# Patient Record
Sex: Female | Born: 1960 | Race: White | Marital: Married | Smoking: Never smoker
Health system: Southern US, Community
[De-identification: ages and names within clinical notes are randomized; demographics above are authoritative.]

## PROBLEM LIST (undated history)

## (undated) DIAGNOSIS — Z800 Family history of malignant neoplasm of digestive organs: Secondary | ICD-10-CM

## (undated) DIAGNOSIS — T4145XA Adverse effect of unspecified anesthetic, initial encounter: Secondary | ICD-10-CM

## (undated) DIAGNOSIS — T8859XA Other complications of anesthesia, initial encounter: Secondary | ICD-10-CM

## (undated) DIAGNOSIS — Z8042 Family history of malignant neoplasm of prostate: Secondary | ICD-10-CM

## (undated) DIAGNOSIS — T7840XA Allergy, unspecified, initial encounter: Secondary | ICD-10-CM

## (undated) DIAGNOSIS — Z803 Family history of malignant neoplasm of breast: Secondary | ICD-10-CM

## (undated) DIAGNOSIS — C50919 Malignant neoplasm of unspecified site of unspecified female breast: Secondary | ICD-10-CM

## (undated) DIAGNOSIS — T753XXA Motion sickness, initial encounter: Secondary | ICD-10-CM

## (undated) HISTORY — DX: Malignant neoplasm of unspecified site of unspecified female breast: C50.919

## (undated) HISTORY — PX: ENDOMETRIAL ABLATION: SHX621

## (undated) HISTORY — DX: Family history of malignant neoplasm of digestive organs: Z80.0

## (undated) HISTORY — PX: WRIST SURGERY: SHX841

## (undated) HISTORY — PX: WISDOM TOOTH EXTRACTION: SHX21

## (undated) HISTORY — DX: Allergy, unspecified, initial encounter: T78.40XA

## (undated) HISTORY — DX: Family history of malignant neoplasm of prostate: Z80.42

## (undated) HISTORY — DX: Family history of malignant neoplasm of breast: Z80.3

---
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
## 2017-07-18 ENCOUNTER — Ambulatory Visit
Admission: RE | Admit: 2017-07-18 | Discharge: 2017-07-18 | Disposition: A | Discharge status: Home / Self Care | Payer: BC Managed Care – PPO | Source: Ambulatory Visit | Attending: Family Medicine | Admitting: Family Medicine

## 2017-07-18 ENCOUNTER — Other Ambulatory Visit: Payer: Self-pay | Admitting: Family Medicine

## 2017-07-18 DIAGNOSIS — M2560 Stiffness of unspecified joint, not elsewhere classified: Secondary | ICD-10-CM

## 2017-07-18 DIAGNOSIS — M79642 Pain in left hand: Secondary | ICD-10-CM

## 2017-12-26 ENCOUNTER — Other Ambulatory Visit (HOSPITAL_COMMUNITY)
Admission: RE | Admit: 2017-12-26 | Discharge: 2017-12-26 | Disposition: A | Discharge status: Home / Self Care | Payer: BC Managed Care – PPO | Source: Ambulatory Visit | Attending: Family Medicine | Admitting: Family Medicine

## 2017-12-26 ENCOUNTER — Other Ambulatory Visit: Payer: Self-pay | Admitting: Family Medicine

## 2017-12-26 DIAGNOSIS — Z0000 Encounter for general adult medical examination without abnormal findings: Secondary | ICD-10-CM | POA: Diagnosis present

## 2017-12-30 LAB — CYTOLOGY - PAP
Diagnosis: NEGATIVE
HPV: NOT DETECTED

## 2018-03-03 DIAGNOSIS — M65332 Trigger finger, left middle finger: Secondary | ICD-10-CM | POA: Insufficient documentation

## 2018-03-03 DIAGNOSIS — M65312 Trigger thumb, left thumb: Secondary | ICD-10-CM | POA: Insufficient documentation

## 2018-12-22 ENCOUNTER — Encounter: Payer: Self-pay | Admitting: Internal Medicine

## 2019-01-05 ENCOUNTER — Other Ambulatory Visit: Payer: Self-pay

## 2019-01-13 ENCOUNTER — Other Ambulatory Visit: Payer: Self-pay | Admitting: General Surgery

## 2019-01-13 ENCOUNTER — Encounter: Payer: Self-pay | Admitting: Adult Health

## 2019-01-13 DIAGNOSIS — C50412 Malignant neoplasm of upper-outer quadrant of left female breast: Secondary | ICD-10-CM | POA: Insufficient documentation

## 2019-01-13 DIAGNOSIS — Z170 Estrogen receptor positive status [ER+]: Secondary | ICD-10-CM

## 2019-01-15 ENCOUNTER — Telehealth: Payer: Self-pay | Admitting: Hematology and Oncology

## 2019-01-15 NOTE — Telephone Encounter (Signed)
A new patient appt has been cld and scheduled to see Dr. [name] on 12/15 at 1pm and genetics on 12/16 at 2pm.

## 2019-01-18 ENCOUNTER — Ambulatory Visit (AMBULATORY_SURGERY_CENTER): Payer: BC Managed Care – PPO | Admitting: *Deleted

## 2019-01-18 ENCOUNTER — Encounter: Payer: Self-pay | Admitting: Internal Medicine

## 2019-01-18 ENCOUNTER — Other Ambulatory Visit: Payer: Self-pay

## 2019-01-18 VITALS — Temp 97.5°F | Ht 64.0 in | Wt 144.4 lb

## 2019-01-18 DIAGNOSIS — Z1159 Encounter for screening for other viral diseases: Secondary | ICD-10-CM

## 2019-01-18 DIAGNOSIS — Z1211 Encounter for screening for malignant neoplasm of colon: Secondary | ICD-10-CM

## 2019-01-18 NOTE — Progress Notes (Signed)
[facility] CONSULT NOTE  Patient Care Team: [name], PA as PCP - General (Family Medicine)  CHIEF COMPLAINTS/PURPOSE OF CONSULTATION:  Newly diagnosed breast cancer  HISTORY OF PRESENTING ILLNESS:  Judith Dixon 58 y.o. female is here because of recent diagnosis of invasive ductal carcinoma of the left breast. The cancer was detected on a routine screening mammogram on 11/23/18. Diagnostic mammogram and US of the left breast on 12/17/18 showed a 0.6cm architectural distortion in the left breast. Biopsy on 01/05/19 showed invasive ductal carcinoma with DCIS, grade 2, HER-2 negative by FISH, ER+ 90%, PR+ 85%, Ki67 15%. She presents to the clinic today for initial evaluation and discuss of treatment options.  Patient is a Public librarian at [employer].  I reviewed her records extensively and collaborated the history with the patient.  SUMMARY OF ONCOLOGIC HISTORY: Oncology History  Malignant neoplasm of upper-outer quadrant of left breast in female, estrogen receptor positive ([place]  01/05/2019 Cancer Staging   Staging form: Breast, AJCC 8th Edition - Clinical stage from 01/05/2019: Stage IA (cT1b, cN0, cM0, G2, ER+, PR+, HER2-) - Signed by [name], NP on 01/13/2019   01/05/2019 Initial Diagnosis   Routine screening mammogram detected a 0.6cm architectural distortion in the left breast. Biopsy showed IDC with DCIS, grade 2, HER-2 - by FISH, ER+ 90%, PR+ 85%, Ki67 15%.     MEDICAL HISTORY:  Past Medical History:  Diagnosis Date  . Allergy   . Cancer [facility])    left breast cancer diagnosed 01-05-2019- surgery 02-10-2019    SURGICAL HISTORY: Past Surgical History:  Procedure Laterality Date  . WISDOM TOOTH EXTRACTION     age [age]  . WRIST SURGERY      SOCIAL HISTORY: Social History   Socioeconomic History  . Marital status: Married    Spouse name: Not on file  . Number of children: Not on file  . Years of education: Not  on file  . Highest education level: Not on file  Occupational History  . Not on file  Tobacco Use  . Smoking status: Never Smoker  . Smokeless tobacco: Never Used  Substance and Sexual Activity  . Alcohol use: Yes    Comment: rare   . Drug use: Never  . Sexual activity: Not on file  Other Topics Concern  . Not on file  Social History Narrative  . Not on file   Social Determinants of Health   Financial Resource Strain:   . Difficulty of Paying Living Expenses: Not on file  Food Insecurity:   . Worried About Charity fundraiser in the Last Year: Not on file  . Ran Out of Food in the Last Year: Not on file  Transportation Needs:   . Lack of Transportation (Medical): Not on file  . Lack of Transportation (Non-Medical): Not on file  Physical Activity:   . Days of Exercise per Week: Not on file  . Minutes of Exercise per Session: Not on file  Stress:   . Feeling of Stress : Not on file  Social Connections:   . Frequency of Communication with Friends and Family: Not on file  . Frequency of Social Gatherings with Friends and Family: Not on file  . Attends Religious Services: Not on file  . Active Member of Clubs or Organizations: Not on file  . Attends Archivist Meetings: Not on file  . Marital Status: Not on file  Intimate Partner Violence:   . Fear  of Current or Ex-Partner: Not on file  . Emotionally Abused: Not on file  . Physically Abused: Not on file  . Sexually Abused: Not on file    FAMILY HISTORY: Family History  Problem Relation Age of Onset  . Colon cancer Maternal Grandmother 30  . Colon polyps Mother   . Colon polyps Father   . Esophageal cancer Neg Hx   . Rectal cancer Neg Hx   . Stomach cancer Neg Hx     ALLERGIES:  has No Known Allergies.  MEDICATIONS:  Current Outpatient Medications  Medication Sig Dispense Refill  . Biotin 1 MG CAPS Take by mouth.    . EPINEPHrine 0.3 mg/0.3 mL IJ SOAJ injection USE AS DIRECTED FOR SEVERE ALLERGIC  REACTIONS    . Multiple Vitamin (MULTIVITAMIN) tablet Take 1 tablet by mouth daily.    Marland Kitchen OVER THE COUNTER MEDICATION Natural vitamin for hair growth    . Polyethylene Glycol 3350 (MIRALAX PO) Take by mouth. 238 grams for colon prep 12-21     No current facility-administered medications for this visit.    REVIEW OF SYSTEMS:   Constitutional: Denies fevers, chills or abnormal night sweats Eyes: Denies blurriness of vision, double vision or watery eyes Ears, nose, mouth, throat, and face: Denies mucositis or sore throat Respiratory: Denies cough, dyspnea or wheezes Cardiovascular: Denies palpitation, chest discomfort or lower extremity swelling Gastrointestinal:  Denies nausea, heartburn or change in bowel habits Skin: Denies abnormal skin rashes Lymphatics: Denies new lymphadenopathy or easy bruising Neurological:Denies numbness, tingling or new weaknesses Behavioral/Psych: Mood is stable, no new changes  Breast: Denies any palpable lumps or discharge All other systems were reviewed with the patient and are negative.  PHYSICAL EXAMINATION: ECOG PERFORMANCE STATUS: 0 - Asymptomatic No physical exam abnormalities detected  Vitals:   01/19/19 1318  BP: 131/81  Pulse: 69  Resp: 18  Temp: 98 F (36.7 C)  SpO2: 98%   Filed Weights   01/19/19 1318  Weight: 145 lb 6.4 oz (66 kg)   RADIOGRAPHIC STUDIES: I have personally reviewed the radiological reports and agreed with the findings in the report.  ASSESSMENT AND PLAN:  Malignant neoplasm of upper-outer quadrant of left breast in female, estrogen receptor positive ([place]) 01/05/2019: Routine screening mammogram detected a 0.6cm architectural distortion in the left breast. Biopsy showed IDC with DCIS, grade 2, HER-2 - by FISH, ER+ 90%, PR+ 85%, Ki67 15%. T1BN0 stage Ia clinical stage  Pathology and radiology counseling:Discussed with the patient, the details of pathology including the type of breast cancer,the clinical staging, the  significance of ER, PR and HER-2/neu receptors and the implications for treatment. After reviewing the pathology in detail, we proceeded to discuss the different treatment options between surgery, radiation, chemotherapy, antiestrogen therapies.  Recommendations: 1. Breast conserving surgery followed by 2. Oncotype DX testing to determine if chemotherapy would be of any benefit followed by 3. Adjuvant radiation therapy followed by 4. Adjuvant antiestrogen therapy  Oncotype counseling: I discussed Oncotype DX test. I explained to the patient that this is a 21 gene panel to evaluate patient tumors DNA to calculate recurrence score. This would help determine whether patient has high risk or intermediate risk or low risk breast cancer. She understands that if her tumor was found to be high risk, she would benefit from systemic chemotherapy. If low risk, no need of chemotherapy. If she was found to be intermediate risk, we would need to evaluate the score as well as other risk factors and determine if  an abbreviated chemotherapy may be of benefit.  Return to clinic after surgery to discuss final pathology report and then determine if Oncotype DX testing will need to be sent.     All questions were answered. The patient knows to call the clinic with any problems, questions or concerns.   Rulon Eisenmenger, MD, MPH 01/19/2019    I, Molly Dorshimer, am acting as scribe for [name], MD.  I have reviewed the above document for accuracy and completeness, and I agree with the above.

## 2019-01-18 NOTE — Progress Notes (Signed)
No egg or soy allergy known to patient  No issues with past sedation with any surgeries  or procedures, no intubation problems  No diet pills per patient No home 02 use per patient  No blood thinners per patient  Pt denies issues with constipation  No A fib or A flutter  EMMI video sent to pt's e mail   Pt was diagnosed with breast cancer 01-05-2019-  Her onc said ok to proceed with colon 12-21  Due to the COVID-19 pandemic we are asking patients to follow these guidelines. Please only bring one care partner. Please be aware that your care partner may wait in the car in the parking lot or if they feel like they will be too hot to wait in the car, they may wait in the lobby on the 4th floor. All care partners are required to wear a mask the entire time (we do not have any that we can provide them), they need to practice social distancing, and we will do a Covid check for all patient's and care partners when you arrive. Also we will check their temperature and your temperature. If the care partner waits in their car they need to stay in the parking lot the entire time and we will call them on their cell phone when the patient is ready for discharge so they can bring the car to the front of the building. Also all patient's will need to wear a mask into building.

## 2019-01-19 ENCOUNTER — Inpatient Hospital Stay: Payer: BC Managed Care – PPO | Attending: Hematology and Oncology | Admitting: Hematology and Oncology

## 2019-01-19 ENCOUNTER — Other Ambulatory Visit: Payer: Self-pay

## 2019-01-19 ENCOUNTER — Telehealth: Payer: Self-pay | Admitting: Hematology and Oncology

## 2019-01-19 ENCOUNTER — Telehealth: Payer: Self-pay | Admitting: *Deleted

## 2019-01-19 DIAGNOSIS — Z170 Estrogen receptor positive status [ER+]: Secondary | ICD-10-CM | POA: Insufficient documentation

## 2019-01-19 DIAGNOSIS — Z79899 Other long term (current) drug therapy: Secondary | ICD-10-CM | POA: Diagnosis not present

## 2019-01-19 DIAGNOSIS — Z79810 Long term (current) use of selective estrogen receptor modulators (SERMs): Secondary | ICD-10-CM | POA: Insufficient documentation

## 2019-01-19 DIAGNOSIS — C50412 Malignant neoplasm of upper-outer quadrant of left female breast: Secondary | ICD-10-CM

## 2019-01-19 NOTE — Assessment & Plan Note (Signed)
01/05/2019: Routine screening mammogram detected a 0.6cm architectural distortion in the left breast. Biopsy showed IDC with DCIS, grade 2, HER-2 - by FISH, ER+ 90%, PR+ 85%, Ki67 15%. T1BN0 stage Ia clinical stage  Pathology and radiology counseling:Discussed with the patient, the details of pathology including the type of breast cancer,the clinical staging, the significance of ER, PR and HER-2/neu receptors and the implications for treatment. After reviewing the pathology in detail, we proceeded to discuss the different treatment options between surgery, radiation, chemotherapy, antiestrogen therapies.  Recommendations: 1. Breast conserving surgery followed by 2. Oncotype DX testing to determine if chemotherapy would be of any benefit followed by 3. Adjuvant radiation therapy followed by 4. Adjuvant antiestrogen therapy  Oncotype counseling: I discussed Oncotype DX test. I explained to the patient that this is a 21 gene panel to evaluate patient tumors DNA to calculate recurrence score. This would help determine whether patient has high risk or intermediate risk or low risk breast cancer. She understands that if her tumor was found to be high risk, she would benefit from systemic chemotherapy. If low risk, no need of chemotherapy. If she was found to be intermediate risk, we would need to evaluate the score as well as other risk factors and determine if an abbreviated chemotherapy may be of benefit.  Return to clinic after surgery to discuss final pathology report and then determine if Oncotype DX testing will need to be sent.

## 2019-01-19 NOTE — Telephone Encounter (Signed)
I talk with patient regarding schedule  

## 2019-01-19 NOTE — Telephone Encounter (Signed)
Called pt to provide navigation resources and contact information. Pt denies questions or concerns at this time. Encourage pt to call with needs. Received verbal understanding.

## 2019-01-20 ENCOUNTER — Other Ambulatory Visit: Payer: Self-pay | Admitting: Genetic Counselor

## 2019-01-20 ENCOUNTER — Encounter: Payer: Self-pay | Admitting: Radiation Oncology

## 2019-01-20 ENCOUNTER — Inpatient Hospital Stay: Payer: BC Managed Care – PPO

## 2019-01-20 ENCOUNTER — Other Ambulatory Visit: Payer: Self-pay

## 2019-01-20 ENCOUNTER — Telehealth: Payer: Self-pay | Admitting: Genetic Counselor

## 2019-01-20 ENCOUNTER — Inpatient Hospital Stay (HOSPITAL_BASED_OUTPATIENT_CLINIC_OR_DEPARTMENT_OTHER): Payer: BC Managed Care – PPO | Admitting: Genetic Counselor

## 2019-01-20 ENCOUNTER — Other Ambulatory Visit: Payer: BC Managed Care – PPO

## 2019-01-20 ENCOUNTER — Inpatient Hospital Stay: Payer: BC Managed Care – PPO | Admitting: Genetic Counselor

## 2019-01-20 ENCOUNTER — Ambulatory Visit (INDEPENDENT_AMBULATORY_CARE_PROVIDER_SITE_OTHER): Payer: BC Managed Care – PPO

## 2019-01-20 ENCOUNTER — Ambulatory Visit
Admission: RE | Admit: 2019-01-20 | Discharge: 2019-01-20 | Disposition: A | Discharge status: Home / Self Care | Payer: BC Managed Care – PPO | Source: Ambulatory Visit | Attending: Radiation Oncology | Admitting: Radiation Oncology

## 2019-01-20 ENCOUNTER — Encounter: Payer: Self-pay | Admitting: Genetic Counselor

## 2019-01-20 VITALS — BP 124/71 | HR 72 | Temp 98.0°F | Resp 18 | Ht 64.0 in | Wt 144.1 lb

## 2019-01-20 DIAGNOSIS — C50412 Malignant neoplasm of upper-outer quadrant of left female breast: Secondary | ICD-10-CM

## 2019-01-20 DIAGNOSIS — Z803 Family history of malignant neoplasm of breast: Secondary | ICD-10-CM | POA: Diagnosis not present

## 2019-01-20 DIAGNOSIS — Z800 Family history of malignant neoplasm of digestive organs: Secondary | ICD-10-CM | POA: Insufficient documentation

## 2019-01-20 DIAGNOSIS — Z8042 Family history of malignant neoplasm of prostate: Secondary | ICD-10-CM | POA: Diagnosis not present

## 2019-01-20 DIAGNOSIS — Z1159 Encounter for screening for other viral diseases: Secondary | ICD-10-CM

## 2019-01-20 DIAGNOSIS — Z170 Estrogen receptor positive status [ER+]: Secondary | ICD-10-CM | POA: Diagnosis not present

## 2019-01-20 DIAGNOSIS — Z79899 Other long term (current) drug therapy: Secondary | ICD-10-CM | POA: Diagnosis not present

## 2019-01-20 LAB — GENETIC SCREENING ORDER

## 2019-01-20 NOTE — Telephone Encounter (Signed)
Called patient regarding upcoming virtual visit, patient would prefer this to be a walk-in visit.

## 2019-01-20 NOTE — Patient Instructions (Signed)
Coronavirus (COVID-19) Are you at risk?  Are you at risk for the Coronavirus (COVID-19)?  To be considered HIGH RISK for Coronavirus (COVID-19), you have to meet the following criteria:  . Traveled to [place], [place], [place], [place] or [place]; or in the [place] to [place], [place], [place], or [place]; and have fever, cough, and shortness of breath within the last 2 weeks of travel OR . Been in close contact with a person diagnosed with COVID-19 within the last 2 weeks and have fever, cough, and shortness of breath . IF YOU DO NOT MEET THESE CRITERIA, YOU ARE CONSIDERED LOW RISK FOR COVID-19.  What to do if you are HIGH RISK for COVID-19?  . If you are having a medical emergency, call 911. . Seek medical care right away. Before you go to a doctor's office, urgent care or emergency department, call ahead and tell them about your recent travel, contact with someone diagnosed with COVID-19, and your symptoms. You should receive instructions from your physician's office regarding next steps of care.  . When you arrive at healthcare provider, tell the healthcare staff immediately you have returned from visiting [place], [place], [place], [place] or [place]; or traveled in the [place] to [place], [place], [place], or [place]; in the last two weeks or you have been in close contact with a person diagnosed with COVID-19 in the last 2 weeks.   . Tell the health care staff about your symptoms: fever, cough and shortness of breath. . After you have been seen by a medical provider, you will be either: o Tested for (COVID-19) and discharged home on quarantine except to seek medical care if symptoms worsen, and asked to  - Stay home and avoid contact with others until you get your results (4-5 days)  - Avoid travel on public transportation if possible (such as bus, train, or airplane) or o Sent to the Emergency Department by EMS for evaluation, COVID-19 testing, and possible  admission depending on your condition and test results.  What to do if you are LOW RISK for COVID-19?  Reduce your risk of any infection by using the same precautions used for avoiding the common cold or flu:  . Wash your hands often with soap and warm water for at least 20 seconds.  If soap and water are not readily available, use an alcohol-based hand sanitizer with at least 60% alcohol.  . If coughing or sneezing, cover your mouth and nose by coughing or sneezing into the elbow areas of your shirt or coat, into a tissue or into your sleeve (not your hands). . Avoid shaking hands with others and consider head nods or verbal greetings only. . Avoid touching your eyes, nose, or mouth with unwashed hands.  . Avoid close contact with people who are sick. . Avoid places or events with large numbers of people in one location, like concerts or sporting events. . Carefully consider travel plans you have or are making. . If you are planning any travel outside or inside the [place], visit the CDC's Travelers' Health webpage for the latest health notices. . If you have some symptoms but not all symptoms, continue to monitor at home and seek medical attention if your symptoms worsen. . If you are having a medical emergency, call 911.   ADDITIONAL HEALTHCARE OPTIONS FOR PATIENTS  [facility] / e-Visit: [email or web address]         [facility]: [phone]  [facility]: [phone]                   [facility]: [phone]   

## 2019-01-20 NOTE — Progress Notes (Signed)
REFERRING PROVIDER: Rolm Bookbinder, MD [address],  [place] [address]  PRIMARY PROVIDER:  [name], PA  PRIMARY REASON FOR VISIT:  1. Malignant neoplasm of upper-outer quadrant of left breast in female, estrogen receptor positive ([place])   2. Family history of breast cancer   3. Family history of prostate cancer   4. Family history of colon cancer      HISTORY OF PRESENT ILLNESS:   Judith Dixon, a 58 y.o. female, was seen for a [facility] cancer genetics consultation at the request of Dr. [name] due to a personal and family history of breast cancer.  [name] presents to clinic today to discuss the possibility of a hereditary predisposition to cancer, genetic testing, and to further clarify her future cancer risks, as well as potential cancer risks for family members.   In December 2020, at the age of 29, [name] was diagnosed with invasive ductal carcinoma of the left breast. The treatment plan includes lumpectomy and radiation, with chemotherapy if pathology indicates it  The tumor is ER+/PR+/Her2-.Marland Kitchen     CANCER HISTORY:  Oncology History  Malignant neoplasm of upper-outer quadrant of left breast in female, estrogen receptor positive ([place])  01/05/2019 Cancer Staging   Staging form: Breast, AJCC 8th Edition - Clinical stage from 01/05/2019: Stage IA (cT1b, cN0, cM0, G2, ER+, PR+, HER2-) - Signed by [name], NP on 01/13/2019   01/05/2019 Initial Diagnosis   Routine screening mammogram detected a 0.6cm architectural distortion in the left breast. Biopsy showed IDC with DCIS, grade 2, HER-2 - by FISH, ER+ 90%, PR+ 85%, Ki67 15%.      RISK FACTORS:  Menarche was at age 80.  First live birth at age 28.  OCP use for approximately 6-7 years.  Ovaries intact: yes.  Hysterectomy: no.  Menopausal status: postmenopausal.  HRT use: 0 years. Colonoscopy: no; scheduled next week. Mammogram within the last year: yes. Number of breast  biopsies: 1. Up to date with pelvic exams: yes. Any excessive radiation exposure in the past: no  Past Medical History:  Diagnosis Date  . Allergy   . Cancer [facility])    left breast cancer diagnosed 01-05-2019- surgery 02-10-2019  . Family history of breast cancer   . Family history of colon cancer   . Family history of prostate cancer     Past Surgical History:  Procedure Laterality Date  . WISDOM TOOTH EXTRACTION     age 36-20  . WRIST SURGERY      Social History   Socioeconomic History  . Marital status: Married    Spouse name: Not on file  . Number of children: Not on file  . Years of education: Not on file  . Highest education level: Not on file  Occupational History  . Not on file  Tobacco Use  . Smoking status: Never Smoker  . Smokeless tobacco: Never Used  Substance and Sexual Activity  . Alcohol use: Yes    Comment: rare   . Drug use: Never  . Sexual activity: Not on file  Other Topics Concern  . Not on file  Social History Narrative  . Not on file   Social Determinants of Health   Financial Resource Strain:   . Difficulty of Paying Living Expenses: Not on file  Food Insecurity:   . Worried About Charity fundraiser in the Last Year: Not on file  . Ran Out of Food in the Last Year: Not on  file  Transportation Needs:   . Film/video editor (Medical): Not on file  . Lack of Transportation (Non-Medical): Not on file  Physical Activity:   . Days of Exercise per Week: Not on file  . Minutes of Exercise per Session: Not on file  Stress:   . Feeling of Stress : Not on file  Social Connections:   . Frequency of Communication with Friends and Family: Not on file  . Frequency of Social Gatherings with Friends and Family: Not on file  . Attends Religious Services: Not on file  . Active Member of Clubs or Organizations: Not on file  . Attends Archivist Meetings: Not on file  . Marital Status: Not on file     FAMILY HISTORY:  We obtained a  detailed, 4-generation family history.  Significant diagnoses are listed below: Family History  Problem Relation Age of Onset  . Colon cancer Maternal Grandmother 53  . Colon polyps Mother   . Thyroid disease Mother   . Hypertension Mother   . Colon polyps Father   . Prostate cancer Father   . Arthritis Father   . Hypertension Father   . Breast cancer Maternal Aunt        dx 36s  . Breast cancer Cousin 22       maternal first cousin  . Aneurysm Paternal Uncle   . Skin cancer Maternal Grandfather   . Leukemia Paternal Grandmother   . Esophageal cancer Neg Hx   . Rectal cancer Neg Hx   . Stomach cancer Neg Hx     The patient has a son and daughter who are cancer free.  She has a brother and sister who are cancer free.  Both parents are living.  The patient's father had prostate cancer at 40 and has a history of less than 10 colon polyps.  He had one brother who died from a brain aneurysm.  His parents are deceased.  His mother had leukemia in her 48's. And his dad died of natural causes.  The patient's mother had fewer than 10 colon polyps.  She had a brother and two sisters who are cancer free.  One sister had stage 1 cancer in her late 47's to 72's.  Her other sister had a daughter who had stage 1 breast cancer at 1.  The maternal grandparents are deceased.  The grandmother had colon cancer dx at 77 and her grandfather had skin cancer and died of liver issues.  [name] is unaware of previous family history of genetic testing for hereditary cancer risks. Patient's maternal ancestors are of [place] and [place] descent, and paternal ancestors are of [place], [place] and [place] descent. There is no reported Ashkenazi Jewish ancestry. There is no known consanguinity.  GENETIC COUNSELING ASSESSMENT: Ms. Coffel is a 58 y.o. female with a personal and family history of breast cancer which is somewhat suggestive of a hereditary cancer syndrome and predisposition to cancer given the  number of women in the family with breast cancer. We, therefore, discussed and recommended the following at today's visit.   DISCUSSION: We discussed that 5 - 10% of breast cancer is hereditary, with most cases associated with BRCA mutations.  There are other genes that can be associated with hereditary breast cancer syndromes.  These include ATM, CHEK2 and PALB2.  We discussed that testing is beneficial for several reasons including knowing how to follow individuals after completing their treatment, and understand if other family members could be at risk  for cancer and allow them to undergo genetic testing.   We reviewed the characteristics, features and inheritance patterns of hereditary cancer syndromes. We also discussed genetic testing, including the appropriate family members to test, the process of testing, insurance coverage and turn-around-time for results. We discussed the implications of a negative, positive and/or variant of uncertain significant result. In order to get genetic test results in a timely manner so that Ms. Osmon can use these genetic test results for surgical decisions, we recommended Ms. Galyon pursue genetic testing for the 9-gene STAT panel. Once complete, we recommend Ms. Zaun pursue reflex genetic testing to the common hereditary cancer gene panel. The Common Hereditary Gene Panel offered by [facility] includes sequencing and/or deletion duplication testing of the following 48 genes: APC, ATM, AXIN2, BARD1, BMPR1A, BRCA1, BRCA2, BRIP1, CDH1, CDK4, CDKN2A (p14ARF), CDKN2A (p16INK4a), CHEK2, CTNNA1, DICER1, EPCAM (Deletion/duplication testing only), GREM1 (promoter region deletion/duplication testing only), KIT, MEN1, MLH1, MSH2, MSH3, MSH6, MUTYH, NBN, NF1, NHTL1, PALB2, PDGFRA, PMS2, POLD1, POLE, PTEN, RAD50, RAD51C, RAD51D, RNF43, SDHB, SDHC, SDHD, SMAD4, SMARCA4. STK11, TP53, TSC1, TSC2, and VHL.  The following genes were evaluated for sequence changes only: SDHA and HOXB13  c.251G>A variant only.   Based on Ms. Coaxum's personal and family history of cancer, she meets medical criteria for genetic testing. Despite that she meets criteria, she may still have an out of pocket cost.   PLAN: After considering the risks, benefits, and limitations, Ms. Ludemann provided informed consent to pursue genetic testing and the blood sample was sent to [facility] for analysis of the 9-gene stat panel and common hereditary cancer panel. Results should be available within approximately 2-3 weeks' time, at which point they will be disclosed by telephone to Ms. Kivi, as will any additional recommendations warranted by these results. Ms. Malak will receive a summary of her genetic counseling visit and a copy of her results once available. This information will also be available in Epic.   Lastly, we encouraged Ms. Castelluccio to remain in contact with cancer genetics annually so that we can continuously update the family history and inform her of any changes in cancer genetics and testing that may be of benefit for this family.   Ms. Grima questions were answered to her satisfaction today. Our contact information was provided should additional questions or concerns arise. Thank you for the referral and allowing [place] to share in the care of your patient.   Mort Smelser P. Florene Glen, [place], [facility] Licensed, Insurance risk surveyor [email or web address] phone: [phone]  The patient was seen for a total of 45 minutes in face-to-face genetic counseling.  This patient was discussed with Drs. Magrinat, [name] and/or Burr Medico who agrees with the above.    _______________________________________________________________________ For Office Staff:  Number of people involved in session: 1 Was an Intern/ student involved with case: no

## 2019-01-20 NOTE — Progress Notes (Signed)
Location of Breast Cancer: Malignant neoplasm of upper-outer quadrant of left breast in female, estrogen receptor positive   Histology per Pathology Report: 01/05/19: Diagnosis Breast, left, needle core biopsy, 3 o'clock, 3cmfn - INVASIVE DUCTAL CARCINOMA. - DUCTAL CARCINOMA IN SITU.  Receptor Status: ER(90%), PR (85%), Her2-neu (negative), Ki-(15%)  Did patient present with symptoms (if so, please note symptoms) or was this found on screening mammography?: 01/05/2019: Routine screening mammogram detected a 0.6cm architectural distortion in the left breast.   Past/Anticipated interventions by surgeon, if any: Surgery scheduled for 02/10/19: LEFT BREAST LUMPECTOMY WITH RADIOACTIVE SEED AND LEFT AXILLARY SENTINEL LYMPH NODE BIOPSY Rolm Bookbinder, MD  Past/Anticipated interventions by medical oncology, if any: Chemotherapy Per Dr. [name] 01/19/19:  Recommendations: 1. Breast conserving surgery followed by 2. Oncotype DX testing to determine if chemotherapy would be of any benefit followed by 3. Adjuvant radiation therapy followed by 4. Adjuvant antiestrogen therapy  Lymphedema issues, if any:  Pt has not had surgery at this time.  Pain issues, if any:  Pt denies c/o pain.   SAFETY ISSUES:  Prior radiation? No  Pacemaker/ICD? No  Possible current pregnancy? No  Is the patient on methotrexate? No  Current Complaints / other details:  Pt presents today for initial consult with Dr. Sondra Come for Radiation Oncology. Pt has not had breast surgery at this time. 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   BP 124/71 (BP Location: Left Arm, Patient Position: Sitting)   Pulse 72   Temp 98 F (36.7 C) (Temporal)   Resp 18   Ht 5' 4"  (1.626 m)   Wt 144 lb 2 oz (65.4 kg)   SpO2 99%   BMI 24.74 kg/m   Wt Readings from Last 3 Encounters:  01/20/19 144 lb 2 oz (65.4 kg)  01/19/19 145 lb 6.4 oz (66 kg)  01/18/19 144 lb 6.4 oz (65.5 kg)        Loma Sousa, RN 01/20/2019,3:48 PM

## 2019-01-20 NOTE — Progress Notes (Signed)
Radiation Oncology         [phone] ________________________________  Initial Outpatient Consultation  Name: Judith Dixon MRN: [id number]  Date: 01/20/2019  DOB: [birth year]  EN:IDPOEU, Mancel Bale, PA  Rolm Bookbinder, MD   REFERRING PHYSICIAN: Rolm Bookbinder, MD  DIAGNOSIS: The encounter diagnosis was Malignant neoplasm of upper-outer quadrant of left breast in female, estrogen receptor positive ([place]).  Stage IA (cT1b, cN0, cM0) Left Breast UOQ, Invasive Ductal Carcinoma with DCIS, ER+ / PR+ / Her2-, Grade 2  HISTORY OF PRESENT ILLNESS::Judith Dixon is a 58 y.o. female who is accompanied by no one due to COVID-19 restrictions. She had routine screening mammography on 11/23/2018 showing area of architectural distortion with associated calcifications in the central lateral left breast at mid depth. She underwent unilateral diagnostic mammography and left breast ultrasonography at [facility] on 12/17/2018 showing 0.6cm taller than wide irregular mass in the left breast that was highly suggestive of malignancy.  Biopsy on 01/05/2019 showed invasive ductal carcinoma with DCIS, grade 2. Prognostic indicators significant for: estrogen receptor, 90% positive and progesterone receptor, 85% positive, both with strong staining intensities. Proliferation marker Ki67 at 15%. HER2 negative.  The patient had an appointment with Dr. [name] on 01/19/2019, during which he recommended breast conserving surgery followed by oncotype DX testing, adjuvant radiation therapy, and adjuvant antiestrogen therapy.   PREVIOUS RADIATION THERAPY: No  PAST MEDICAL HISTORY:  Past Medical History:  Diagnosis Date  . Allergy   . Cancer [facility])    left breast cancer diagnosed 01-05-2019- surgery 02-10-2019  . Family history of breast cancer   . Family history of colon cancer   . Family history of prostate cancer     PAST SURGICAL HISTORY: Past Surgical History:  Procedure Laterality Date  . WISDOM TOOTH  EXTRACTION     age 54-20  . WRIST SURGERY      FAMILY HISTORY:  Family History  Problem Relation Age of Onset  . Colon cancer Maternal Grandmother 5  . Colon polyps Mother   . Thyroid disease Mother   . Hypertension Mother   . Colon polyps Father   . Prostate cancer Father   . Arthritis Father   . Hypertension Father   . Breast cancer Maternal Aunt        dx 66s  . Breast cancer Cousin 20       maternal first cousin  . Aneurysm Paternal Uncle   . Skin cancer Maternal Grandfather   . Leukemia Paternal Grandmother   . Esophageal cancer Neg Hx   . Rectal cancer Neg Hx   . Stomach cancer Neg Hx     SOCIAL HISTORY:  Social History   Tobacco Use  . Smoking status: Never Smoker  . Smokeless tobacco: Never Used  Substance Use Topics  . Alcohol use: Yes    Comment: rare   . Drug use: Never    ALLERGIES: No Known Allergies  MEDICATIONS:  Current Outpatient Medications  Medication Sig Dispense Refill  . Biotin 1 MG CAPS Take by mouth.    . Multiple Vitamin (MULTIVITAMIN) tablet Take 1 tablet by mouth daily.    Marland Kitchen OVER THE COUNTER MEDICATION Natural vitamin for hair growth    . EPINEPHrine 0.3 mg/0.3 mL IJ SOAJ injection USE AS DIRECTED FOR SEVERE ALLERGIC REACTIONS    . Polyethylene Glycol 3350 (MIRALAX PO) Take by mouth. 238 grams for colon prep 12-21     No current facility-administered medications for this encounter.    REVIEW OF SYSTEMS:  A 10+ POINT REVIEW OF SYSTEMS WAS OBTAINED including neurology, dermatology, psychiatry, cardiac, respiratory, lymph, extremities, GI, GU, musculoskeletal, constitutional, reproductive, HEENT.  No reports of breast pain nipple discharge or bleeding prior to diagnosis   PHYSICAL EXAM:  height is 5' 4"  (1.626 m) and weight is 144 lb 2 oz (65.4 kg). Her temporal temperature is 98 F (36.7 C). Her blood pressure is 124/71 and her pulse is 72. Her respiration is 18 and oxygen saturation is 99%.   General: Alert and oriented, in no acute  distress HEENT: Head is normocephalic. Extraocular movements are intact. Oropharynx not examined due to COVID-19 issues Neck: Neck is supple, no palpable cervical or supraclavicular lymphadenopathy. Heart: Regular in rate and rhythm with no murmurs, rubs, or gallops. Chest: Clear to auscultation bilaterally, with no rhonchi, wheezes, or rales. Abdomen: Soft, nontender, nondistended, with no rigidity or guarding. Extremities: No cyanosis or edema. Lymphatics: see Neck Exam Skin: No concerning lesions. Musculoskeletal: symmetric strength and muscle tone throughout. Neurologic: Cranial nerves II through XII are grossly intact. No obvious focalities. Speech is fluent. Coordination is intact. Psychiatric: Judgment and insight are intact. Affect is appropriate. Right breast: No palpable mass, nipple discharge, or bleeding. Left breast: Small biopsy site in the lateral aspect of the breast.  No dominant mass appreciated,  breast nipple discharge or bleeding  ECOG = 0  0 - Asymptomatic (Fully active, able to carry on all predisease activities without restriction)  1 - Symptomatic but completely ambulatory (Restricted in physically strenuous activity but ambulatory and able to carry out work of a light or sedentary nature. For example, light housework, office work)  2 - Symptomatic, <50% in bed during the day (Ambulatory and capable of all self care but unable to carry out any work activities. Up and about more than 50% of waking hours)  3 - Symptomatic, >50% in bed, but not bedbound (Capable of only limited self-care, confined to bed or chair 50% or more of waking hours)  4 - Bedbound (Completely disabled. Cannot carry on any self-care. Totally confined to bed or chair)  5 - Death   Eustace Pen MM, Creech RH, Tormey DC, et al. [phone]). "Toxicity and response criteria of the [facility]". [place] Oncol. 5 (6): 649-55  LABORATORY DATA:  No results found for: WBC, HGB, HCT,  MCV, PLT, NEUTROABS No results found for: NA, K, CL, CO2, GLUCOSE, CREATININE, CALCIUM    RADIOGRAPHY: No results found.    IMPRESSION: Stage IA (cT1b, cN0, cM0) Left Breast UOQ, Invasive Ductal Carcinoma with DCIS, ER+ / PR+ / Her2-, Grade 2  Patient would be an excellent candidate for breast conservation with radiation therapy directed at the left breast.  She is scheduled for surgery with Dr. [name] on January 6.  Dr. [name] has recommended Oncotype DX testing to determine the potential benefit of chemotherapy.  In addition the patient did undergo genetic testing today with results pending on this issue.  We discussed the overall treatment course side effects and potential long-term toxicities of radiation therapy in this situation.  Patient appears to understand and wishes to proceed with this treatment approach..  The patient would appear to be a good candidate for hypofractionated accelerated radiation therapy assuming her lymph nodes showed no evidence of metastatic disease.  PLAN: Left lumpectomy scheduled for 02/10/2019. Pending oncoptype and follow-up with Dr. [name] to discuss whether or not chemotherapy will be beneficial.  Patient will be seen in the postoperative setting for further discussion of  radiation therapy.  After she has completed her radiation therapy she will proceed with adjuvant hormonal therapy.   ------------------------------------------------  Blair Promise, PhD, MD  This document serves as a record of services personally performed by Gery Pray, MD. It was created on his behalf by Clerance Lav, a trained medical scribe. The creation of this record is based on the scribe's personal observations and the provider's statements to them. This document has been checked and approved by the attending provider.

## 2019-01-21 LAB — SARS CORONAVIRUS 2 (TAT 6-24 HRS): SARS Coronavirus 2: NEGATIVE

## 2019-01-25 ENCOUNTER — Other Ambulatory Visit: Payer: Self-pay

## 2019-01-25 ENCOUNTER — Encounter: Payer: Self-pay | Admitting: Internal Medicine

## 2019-01-25 ENCOUNTER — Ambulatory Visit (AMBULATORY_SURGERY_CENTER): Payer: BC Managed Care – PPO | Admitting: Internal Medicine

## 2019-01-25 VITALS — BP 116/66 | HR 69 | Temp 98.1°F | Resp 17 | Ht 64.0 in | Wt 144.0 lb

## 2019-01-25 DIAGNOSIS — Z1211 Encounter for screening for malignant neoplasm of colon: Secondary | ICD-10-CM | POA: Diagnosis present

## 2019-01-25 MED ORDER — SODIUM CHLORIDE 0.9 % IV SOLN: 500.0000 mL | Freq: Once | INTRAVENOUS | Status: DC

## 2019-01-25 NOTE — Op Note (Signed)
[facility] Patient Name: Charlia Gennett Procedure Date: 01/25/2019 1:29 PM MRN: [id number] Endoscopist: Gatha Mayer , MD Age: 58 Referring MD:  Date of Birth: 1960-06-12 Gender: Female Account #: [id number] Procedure:                Colonoscopy Indications:              Screening for colorectal malignant neoplasm, This                            is the patient's first colonoscopy Medicines:                Propofol per Anesthesia, Monitored Anesthesia Care Procedure:                Pre-Anesthesia Assessment:                           - Prior to the procedure, a History and Physical                            was performed, and patient medications and                            allergies were reviewed. The patient's tolerance of                            previous anesthesia was also reviewed. The risks                            and benefits of the procedure and the sedation                            options and risks were discussed with the patient.                            All questions were answered, and informed consent                            was obtained. Prior Anticoagulants: The patient has                            taken no previous anticoagulant or antiplatelet                            agents. ASA Grade Assessment: II - A patient with                            mild systemic disease. After reviewing the risks                            and benefits, the patient was deemed in                            satisfactory condition to undergo the procedure.  After obtaining informed consent, the colonoscope                            was passed under direct vision. Throughout the                            procedure, the patient's blood pressure, pulse, and                            oxygen saturations were monitored continuously. The                            Colonoscope was introduced through the anus and   advanced to the the cecum, identified by                            appendiceal orifice and ileocecal valve. The                            colonoscopy was performed without difficulty. The                            patient tolerated the procedure well. The quality                            of the bowel preparation was excellent. The                            ileocecal valve, appendiceal orifice, and rectum                            were photographed. Scope In: 1:40:00 PM Scope Out: 1:53:49 PM Scope Withdrawal Time: 0 hours 9 minutes 16 seconds  Total Procedure Duration: 0 hours 13 minutes 49 seconds  Findings:                 The perianal and digital rectal examinations were                            normal.                           The entire examined colon appeared normal on direct                            and retroflexion views. Complications:            No immediate complications. Estimated Blood Loss:     Estimated blood loss: none. Impression:               - The entire examined colon is normal on direct and                            retroflexion views.                           - No specimens collected.  Recommendation:           - Patient has a contact number available for                            emergencies. The signs and symptoms of potential                            delayed complications were discussed with the                            patient. Return to normal activities tomorrow.                            Written discharge instructions were provided to the                            patient.                           - Resume previous diet.                           - Continue present medications.                           - Repeat colonoscopy in 10 years for screening                            purposes. Gatha Mayer, MD 01/25/2019 2:08:09 PM This report has been signed electronically.

## 2019-01-25 NOTE — Progress Notes (Signed)
Temp by jb  Vitals by dt  Pt's states no medical or surgical changes since previsit or office visit.

## 2019-01-25 NOTE — Progress Notes (Signed)
A/ox3, pleased with MAC, report to RN 

## 2019-01-25 NOTE — Patient Instructions (Addendum)
Colonoscopy was normal - Next routine colonoscopy or other screening test in 10 years - 2030  I appreciate the opportunity to care for you. Gatha Mayer, MD, FACG   YOU HAD AN ENDOSCOPIC PROCEDURE TODAY AT [facility]:   Refer to the procedure report that was given to you for any specific questions about what was found during the examination.  If the procedure report does not answer your questions, please call your gastroenterologist to clarify.  If you requested that your care partner not be given the details of your procedure findings, then the procedure report has been included in a sealed envelope for you to review at your convenience later.  YOU SHOULD EXPECT: Some feelings of bloating in the abdomen. Passage of more gas than usual.  Walking can help get rid of the air that was put into your GI tract during the procedure and reduce the bloating. If you had a lower endoscopy (such as a colonoscopy or flexible sigmoidoscopy) you may notice spotting of blood in your stool or on the toilet paper. If you underwent a bowel prep for your procedure, you may not have a normal bowel movement for a few days.  Please Note:  You might notice some irritation and congestion in your nose or some drainage.  This is from the oxygen used during your procedure.  There is no need for concern and it should clear up in a day or so.  SYMPTOMS TO REPORT IMMEDIATELY:   Following lower endoscopy (colonoscopy or flexible sigmoidoscopy):  Excessive amounts of blood in the stool  Significant tenderness or worsening of abdominal pains  Swelling of the abdomen that is new, acute  Fever of 100F or higher   For urgent or emergent issues, a gastroenterologist can be reached at any hour by calling [phone].   DIET:  We do recommend a small meal at first, but then you may proceed to your regular diet.  Drink plenty of fluids but you should avoid alcoholic beverages for 24 hours.  ACTIVITY:  You  should plan to take it easy for the rest of today and you should NOT DRIVE or use heavy machinery until tomorrow (because of the sedation medicines used during the test).    FOLLOW UP: Our staff will call the number listed on your records 48-72 hours following your procedure to check on you and address any questions or concerns that you may have regarding the information given to you following your procedure. If we do not reach you, we will leave a message.  We will attempt to reach you two times.  During this call, we will ask if you have developed any symptoms of COVID 19. If you develop any symptoms (ie: fever, flu-like symptoms, shortness of breath, cough etc.) before then, please call [phone].  If you test positive for Covid 19 in the 2 weeks post procedure, please call and report this information to [place].    If any biopsies were taken you will be contacted by phone or by letter within the next 1-3 weeks.  Please call us at [phone] if you have not heard about the biopsies in 3 weeks.    SIGNATURES/CONFIDENTIALITY: You and/or your care partner have signed paperwork which will be entered into your electronic medical record.  These signatures attest to the fact that that the information above on your After Visit Summary has been reviewed and is understood.  Full responsibility of the confidentiality of this discharge information lies  with you and/or your care-partner.

## 2019-01-27 ENCOUNTER — Telehealth: Payer: Self-pay | Admitting: Licensed Clinical Social Worker

## 2019-01-27 ENCOUNTER — Telehealth: Payer: Self-pay

## 2019-01-27 ENCOUNTER — Encounter: Payer: Self-pay | Admitting: Licensed Clinical Social Worker

## 2019-01-27 ENCOUNTER — Ambulatory Visit: Payer: Self-pay | Admitting: Licensed Clinical Social Worker

## 2019-01-27 DIAGNOSIS — Z1379 Encounter for other screening for genetic and chromosomal anomalies: Secondary | ICD-10-CM | POA: Insufficient documentation

## 2019-01-27 DIAGNOSIS — Z803 Family history of malignant neoplasm of breast: Secondary | ICD-10-CM

## 2019-01-27 DIAGNOSIS — Z170 Estrogen receptor positive status [ER+]: Secondary | ICD-10-CM

## 2019-01-27 DIAGNOSIS — Z800 Family history of malignant neoplasm of digestive organs: Secondary | ICD-10-CM

## 2019-01-27 DIAGNOSIS — C50412 Malignant neoplasm of upper-outer quadrant of left female breast: Secondary | ICD-10-CM

## 2019-01-27 DIAGNOSIS — Z8042 Family history of malignant neoplasm of prostate: Secondary | ICD-10-CM

## 2019-01-27 NOTE — Telephone Encounter (Signed)
Revealed negative genetic testing.  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  We discussed that we do not know why she has breast cancer or why there is cancer in the family. It could be due to a different gene that we are not testing, or something our current technology cannot pick up.  It will be important for her to keep in contact with genetics to learn if additional testing may be needed in the future.

## 2019-01-27 NOTE — Telephone Encounter (Signed)
  Follow up Call-  Call back number 01/25/2019  Post procedure Call Back phone  # [phone]  Permission to leave phone message Yes     Patient questions:  Do you have a fever, pain , or abdominal swelling? No. Pain Score  0 *  Have you tolerated food without any problems? Yes.    Have you been able to return to your normal activities? Yes.    Do you have any questions about your discharge instructions: Diet   No. Medications  No. Follow up visit  No.  Do you have questions or concerns about your Care? No.  Actions: * If pain score is 4 or above: No action needed, pain <4.  1. Have you developed a fever since your procedure? no  2.   Have you had an respiratory symptoms (SOB or cough) since your procedure? no  3.   Have you tested positive for COVID 19 since your procedure no  4.   Have you had any family members/close contacts diagnosed with the COVID 19 since your procedure?  no   If yes to any of these questions please route to Joylene John, RN and Alphonsa Gin, Therapist, sports.

## 2019-01-27 NOTE — Progress Notes (Signed)
HPI:  Ms. Judith Dixon was previously seen in the [facility] clinic due to a personal and family history of cancer and concerns regarding a hereditary predisposition to cancer. Please refer to our prior cancer genetics clinic note for more information regarding our discussion, assessment and recommendations, at the time. Ms. Bouyer recent genetic test results were disclosed to her, as were recommendations warranted by these results. These results and recommendations are discussed in more detail below.  CANCER HISTORY:  Oncology History  Malignant neoplasm of upper-outer quadrant of left breast in female, estrogen receptor positive ([place])  01/05/2019 Cancer Staging   Staging form: Breast, AJCC 8th Edition - Clinical stage from 01/05/2019: Stage IA (cT1b, cN0, cM0, G2, ER+, PR+, HER2-) - Signed by [name], NP on 01/13/2019   01/05/2019 Initial Diagnosis   Routine screening mammogram detected a 0.6cm architectural distortion in the left breast. Biopsy showed IDC with DCIS, grade 2, HER-2 - by FISH, ER+ 90%, PR+ 85%, Ki67 15%.    Genetic Testing   Negative genetic testing. No pathogenic variants identified. VUS in CHEK2 called c.8G>A identified on the [facility] Breast Cancer STAT Panel + Common Hereditary Cancers Panel. The STAT Breast cancer panel offered by [facility] includes sequencing and rearrangement analysis for the following 9 genes:  ATM, BRCA1, BRCA2, CDH1, CHEK2, PALB2, PTEN, STK11 and TP53.  The Common Hereditary Cancers Panel offered by [facility] includes sequencing and/or deletion duplication testing of the following 48 genes: APC, ATM, AXIN2, BARD1, BMPR1A, BRCA1, BRCA2, BRIP1, CDH1, CDKN2A (p14ARF), CDKN2A (p16INK4a), CKD4, CHEK2, CTNNA1, DICER1, EPCAM (Deletion/duplication testing only), GREM1 (promoter region deletion/duplication testing only), KIT, MEN1, MLH1, MSH2, MSH3, MSH6, MUTYH, NBN, NF1, NHTL1, PALB2, PDGFRA, PMS2, POLD1, POLE, PTEN, RAD50, RAD51C, RAD51D, RNF43,  SDHB, SDHC, SDHD, SMAD4, SMARCA4. STK11, TP53, TSC1, TSC2, and VHL.  The following genes were evaluated for sequence changes only: SDHA and HOXB13 c.251G>A variant only. The report date is 01/26/2019.      FAMILY HISTORY:  We obtained a detailed, 4-generation family history.  Significant diagnoses are listed below: Family History  Problem Relation Age of Onset  . Colon cancer Maternal Grandmother 45  . Colon polyps Mother   . Thyroid disease Mother   . Hypertension Mother   . Colon polyps Father   . Prostate cancer Father   . Arthritis Father   . Hypertension Father   . Breast cancer Maternal Aunt        dx 50s  . Breast cancer Cousin 65       maternal first cousin  . Aneurysm Paternal Uncle   . Skin cancer Maternal Grandfather   . Leukemia Paternal Grandmother   . Esophageal cancer Neg Hx   . Rectal cancer Neg Hx   . Stomach cancer Neg Hx     The patient has a son and daughter who are cancer free.  She has a brother and sister who are cancer free.  Both parents are living.  The patient's father had prostate cancer at 43 and has a history of less than 10 colon polyps.  He had one brother who died from a brain aneurysm.  His parents are deceased.  His mother had leukemia in her 37's. And his dad died of natural causes.  The patient's mother had fewer than 10 colon polyps.  She had a brother and two sisters who are cancer free.  One sister had stage 1 cancer in her late 84's to 91's.  Her other sister had a daughter who  had stage 1 breast cancer at 41.  The maternal grandparents are deceased.  The grandmother had colon cancer dx at 21 and her grandfather had skin cancer and died of liver issues.  Ms. Hlavaty is unaware of previous family history of genetic testing for hereditary cancer risks. Patient's maternal ancestors are of [place] and [place] descent, and paternal ancestors are of [place], [place] and [place] descent. There is no reported Ashkenazi Jewish ancestry. There is  no known consanguinity.  GENETIC TEST RESULTS: Genetic testing reported out on 01/26/2019 through the [facility] Breast Cancer STAT Panel + Common Hereditary cancer panel found no pathogenic mutations.  The STAT Breast cancer panel offered by [facility] includes sequencing and rearrangement analysis for the following 9 genes:  ATM, BRCA1, BRCA2, CDH1, CHEK2, PALB2, PTEN, STK11 and TP53.    The Common Hereditary Cancers Panel offered by [facility] includes sequencing and/or deletion duplication testing of the following 48 genes: APC, ATM, AXIN2, BARD1, BMPR1A, BRCA1, BRCA2, BRIP1, CDH1, CDKN2A (p14ARF), CDKN2A (p16INK4a), CKD4, CHEK2, CTNNA1, DICER1, EPCAM (Deletion/duplication testing only), GREM1 (promoter region deletion/duplication testing only), KIT, MEN1, MLH1, MSH2, MSH3, MSH6, MUTYH, NBN, NF1, NHTL1, PALB2, PDGFRA, PMS2, POLD1, POLE, PTEN, RAD50, RAD51C, RAD51D, RNF43, SDHB, SDHC, SDHD, SMAD4, SMARCA4. STK11, TP53, TSC1, TSC2, and VHL.  The following genes were evaluated for sequence changes only: SDHA and HOXB13 c.251G>A variant only.  The test report has been scanned into EPIC and is located under the Molecular Pathology section of the Results Review tab.  A portion of the result report is included below for reference.     We discussed with Ms. Nobis that because current genetic testing is not perfect, it is possible there may be a gene mutation in one of these genes that current testing cannot detect, but that chance is small.  We also discussed, that there could be another gene that has not yet been discovered, or that we have not yet tested, that is responsible for the cancer diagnoses in the family. It is also possible there is a hereditary cause for the cancer in the family that Ms. Adcox did not inherit and therefore was not identified in her testing.  Therefore, it is important to remain in touch with cancer genetics in the future so that we can continue to offer Ms. Finelli the most up to date  genetic testing.   Genetic testing did identify a variant of uncertain significance (VUS) was identified in the CHEK2 gene called c.8G>A.  At this time, it is unknown if this variant is associated with increased cancer risk or if this is a normal finding, but most variants such as this get reclassified to being inconsequential. It should not be used to make medical management decisions. With time, we suspect the lab will determine the significance of this variant, if any. If we do learn more about it, we will try to contact Ms. Cancel to discuss it further. However, it is important to stay in touch with [place] periodically and keep the address and phone number up to date.  ADDITIONAL GENETIC TESTING: We discussed with Ms. Galloway that her genetic testing was fairly extensive.  If there are genes identified to increase cancer risk that can be analyzed in the future, we would be happy to discuss and coordinate this testing at that time.    CANCER SCREENING RECOMMENDATIONS: Ms. Slingerland test result is considered negative (normal).  This means that we have not identified a hereditary cause for her  personal and family history of  cancer at this time. Most cancers happen by chance and this negative test suggests that her cancer may fall into this category.    While reassuring, this does not definitively rule out a hereditary predisposition to cancer. It is still possible that there could be genetic mutations that are undetectable by current technology. There could be genetic mutations in genes that have not been tested or identified to increase cancer risk.  Therefore, it is recommended she continue to follow the cancer management and screening guidelines provided by her oncology and primary healthcare provider.   An individual's cancer risk and medical management are not determined by genetic test results alone. Overall cancer risk assessment incorporates additional factors, including personal medical history,  family history, and any available genetic information that may result in a personalized plan for cancer prevention and surveillance.  RECOMMENDATIONS FOR FAMILY MEMBERS:  Relatives in this family might be at some increased risk of developing cancer, over the general population risk, simply due to the family history of cancer.  We recommended female relatives in this family have a yearly mammogram beginning at age 46, or 74 years younger than the earliest onset of cancer, an annual clinical breast exam, and perform monthly breast self-exams. Female relatives in this family should also have a gynecological exam as recommended by their primary provider. All family members should have a colonoscopy by age 40, or as directed by their physicians.  FOLLOW-UP: Lastly, we discussed with Ms. Kindall that cancer genetics is a rapidly advancing field and it is possible that new genetic tests will be appropriate for her and/or her family members in the future. We encouraged her to remain in contact with cancer genetics on an annual basis so we can update her personal and family histories and let her know of advances in cancer genetics that may benefit this family.   Our contact number was provided. Ms. Colmenares questions were answered to her satisfaction, and she knows she is welcome to call us at anytime with additional questions or concerns.   Faith Rogue, MS, [facility] Genetic Counselor [email or web address] Phone: [phone]

## 2019-02-03 ENCOUNTER — Other Ambulatory Visit: Payer: Self-pay

## 2019-02-03 ENCOUNTER — Encounter (HOSPITAL_BASED_OUTPATIENT_CLINIC_OR_DEPARTMENT_OTHER): Payer: Self-pay | Admitting: General Surgery

## 2019-02-06 ENCOUNTER — Other Ambulatory Visit (HOSPITAL_COMMUNITY)
Admission: RE | Admit: 2019-02-06 | Discharge: 2019-02-06 | Disposition: A | Discharge status: Home / Self Care | Payer: BC Managed Care – PPO | Source: Ambulatory Visit | Attending: General Surgery | Admitting: General Surgery

## 2019-02-06 DIAGNOSIS — Z01812 Encounter for preprocedural laboratory examination: Secondary | ICD-10-CM | POA: Diagnosis present

## 2019-02-06 DIAGNOSIS — Z20822 Contact with and (suspected) exposure to covid-19: Secondary | ICD-10-CM | POA: Diagnosis not present

## 2019-02-07 LAB — NOVEL CORONAVIRUS, NAA (HOSP ORDER, SEND-OUT TO REF LAB; TAT 18-24 HRS): SARS-CoV-2, NAA: NOT DETECTED

## 2019-02-09 MED ORDER — ENSURE PRE-SURGERY PO LIQD: 296.0000 mL | Freq: Once | ORAL | Status: DC

## 2019-02-09 NOTE — Progress Notes (Signed)

## 2019-02-10 ENCOUNTER — Ambulatory Visit (HOSPITAL_COMMUNITY): Payer: BC Managed Care – PPO

## 2019-02-10 ENCOUNTER — Ambulatory Visit (HOSPITAL_BASED_OUTPATIENT_CLINIC_OR_DEPARTMENT_OTHER): Payer: BC Managed Care – PPO | Admitting: Anesthesiology

## 2019-02-10 ENCOUNTER — Encounter (HOSPITAL_BASED_OUTPATIENT_CLINIC_OR_DEPARTMENT_OTHER)
Admission: RE | Disposition: A | Discharge status: Home / Self Care | Payer: Self-pay | Source: Home / Self Care | Attending: General Surgery

## 2019-02-10 ENCOUNTER — Encounter (HOSPITAL_BASED_OUTPATIENT_CLINIC_OR_DEPARTMENT_OTHER): Payer: Self-pay | Admitting: General Surgery

## 2019-02-10 ENCOUNTER — Encounter (HOSPITAL_COMMUNITY)
Admission: RE | Admit: 2019-02-10 | Discharge: 2019-02-10 | Disposition: A | Discharge status: Home / Self Care | Payer: BC Managed Care – PPO | Source: Ambulatory Visit | Attending: General Surgery | Admitting: General Surgery

## 2019-02-10 ENCOUNTER — Ambulatory Visit (HOSPITAL_BASED_OUTPATIENT_CLINIC_OR_DEPARTMENT_OTHER)
Admission: RE | Admit: 2019-02-10 | Discharge: 2019-02-10 | Disposition: A | Discharge status: Home / Self Care | Payer: BC Managed Care – PPO | Attending: General Surgery | Admitting: General Surgery

## 2019-02-10 ENCOUNTER — Other Ambulatory Visit: Payer: Self-pay

## 2019-02-10 DIAGNOSIS — C50412 Malignant neoplasm of upper-outer quadrant of left female breast: Secondary | ICD-10-CM

## 2019-02-10 DIAGNOSIS — Z170 Estrogen receptor positive status [ER+]: Secondary | ICD-10-CM

## 2019-02-10 DIAGNOSIS — C50912 Malignant neoplasm of unspecified site of left female breast: Secondary | ICD-10-CM | POA: Diagnosis present

## 2019-02-10 HISTORY — PX: BREAST LUMPECTOMY WITH RADIOACTIVE SEED AND SENTINEL LYMPH NODE BIOPSY: SHX6550

## 2019-02-10 HISTORY — DX: Other complications of anesthesia, initial encounter: T88.59XA

## 2019-02-10 HISTORY — DX: Motion sickness, initial encounter: T75.3XXA

## 2019-02-10 SURGERY — BREAST LUMPECTOMY WITH RADIOACTIVE SEED AND SENTINEL LYMPH NODE BIOPSY
Anesthesia: General | Site: Breast | Laterality: Left

## 2019-02-10 MED ORDER — PROPOFOL 10 MG/ML IV BOLUS
INTRAVENOUS | Status: DC | PRN
  Administered 2019-02-10: 150 mg via INTRAVENOUS

## 2019-02-10 MED ORDER — SCOPOLAMINE 1 MG/3DAYS TD PT72
MEDICATED_PATCH | TRANSDERMAL | Status: AC
  Filled 2019-02-10: qty 1

## 2019-02-10 MED ORDER — OXYCODONE HCL 5 MG PO TABS: 5.0000 mg | ORAL_TABLET | Freq: Once | ORAL | Status: DC | PRN

## 2019-02-10 MED ORDER — KETOROLAC TROMETHAMINE 30 MG/ML IJ SOLN: 30.0000 mg | Freq: Once | INTRAMUSCULAR | Status: DC | PRN

## 2019-02-10 MED ORDER — MIDAZOLAM HCL 2 MG/2ML IJ SOLN
1.0000 mg | INTRAMUSCULAR | Status: DC | PRN
  Administered 2019-02-10 (×2): 2 mg via INTRAVENOUS

## 2019-02-10 MED ORDER — LACTATED RINGERS IV SOLN: INTRAVENOUS | Status: DC

## 2019-02-10 MED ORDER — FENTANYL CITRATE (PF) 100 MCG/2ML IJ SOLN
25.0000 ug | INTRAMUSCULAR | Status: DC | PRN
  Administered 2019-02-10: 14:00:00 50 ug via INTRAVENOUS
  Administered 2019-02-10: 14:00:00 25 ug via INTRAVENOUS

## 2019-02-10 MED ORDER — FENTANYL CITRATE (PF) 100 MCG/2ML IJ SOLN
INTRAMUSCULAR | Status: AC
  Filled 2019-02-10: qty 2

## 2019-02-10 MED ORDER — PHENYLEPHRINE 40 MCG/ML (10ML) SYRINGE FOR IV PUSH (FOR BLOOD PRESSURE SUPPORT)
PREFILLED_SYRINGE | INTRAVENOUS | Status: AC
  Filled 2019-02-10: qty 10

## 2019-02-10 MED ORDER — ACETAMINOPHEN 500 MG PO TABS
ORAL_TABLET | ORAL | Status: AC
  Filled 2019-02-10: qty 2

## 2019-02-10 MED ORDER — LIDOCAINE 2% (20 MG/ML) 5 ML SYRINGE
INTRAMUSCULAR | Status: AC
  Filled 2019-02-10: qty 5

## 2019-02-10 MED ORDER — ACETAMINOPHEN 500 MG PO TABS
1000.0000 mg | ORAL_TABLET | ORAL | Status: AC
  Administered 2019-02-10: 10:00:00 1000 mg via ORAL

## 2019-02-10 MED ORDER — ACETAMINOPHEN 160 MG/5ML PO SOLN: 325.0000 mg | ORAL | Status: DC | PRN

## 2019-02-10 MED ORDER — ONDANSETRON HCL 4 MG/2ML IJ SOLN
INTRAMUSCULAR | Status: AC
  Filled 2019-02-10: qty 2

## 2019-02-10 MED ORDER — MIDAZOLAM HCL 2 MG/2ML IJ SOLN
INTRAMUSCULAR | Status: AC
  Filled 2019-02-10: qty 2

## 2019-02-10 MED ORDER — CEFAZOLIN SODIUM-DEXTROSE 2-4 GM/100ML-% IV SOLN
2.0000 g | INTRAVENOUS | Status: AC
  Administered 2019-02-10: 2 g via INTRAVENOUS

## 2019-02-10 MED ORDER — PROPOFOL 10 MG/ML IV BOLUS
INTRAVENOUS | Status: AC
  Filled 2019-02-10: qty 20

## 2019-02-10 MED ORDER — OXYCODONE HCL 5 MG/5ML PO SOLN: 5.0000 mg | Freq: Once | ORAL | Status: DC | PRN

## 2019-02-10 MED ORDER — CEFAZOLIN SODIUM-DEXTROSE 2-4 GM/100ML-% IV SOLN
INTRAVENOUS | Status: AC
  Filled 2019-02-10: qty 100

## 2019-02-10 MED ORDER — PHENYLEPHRINE 40 MCG/ML (10ML) SYRINGE FOR IV PUSH (FOR BLOOD PRESSURE SUPPORT)
PREFILLED_SYRINGE | INTRAVENOUS | Status: DC | PRN
  Administered 2019-02-10 (×2): 80 ug via INTRAVENOUS
  Administered 2019-02-10 (×2): 40 ug via INTRAVENOUS
  Administered 2019-02-10 (×2): 80 ug via INTRAVENOUS

## 2019-02-10 MED ORDER — BUPIVACAINE HCL (PF) 0.25 % IJ SOLN
INTRAMUSCULAR | Status: DC | PRN
  Administered 2019-02-10: 8 mL

## 2019-02-10 MED ORDER — SCOPOLAMINE 1 MG/3DAYS TD PT72
1.0000 | MEDICATED_PATCH | TRANSDERMAL | Status: DC
  Administered 2019-02-10: 1.5 mg via TRANSDERMAL

## 2019-02-10 MED ORDER — ONDANSETRON HCL 4 MG/2ML IJ SOLN
INTRAMUSCULAR | Status: DC | PRN
  Administered 2019-02-10: 4 mg via INTRAVENOUS

## 2019-02-10 MED ORDER — OXYCODONE HCL 5 MG PO TABS: 5.0000 mg | ORAL_TABLET | Freq: Four times a day (QID) | ORAL | 0 refills | Status: DC | PRN

## 2019-02-10 MED ORDER — BUPIVACAINE-EPINEPHRINE (PF) 0.5% -1:200000 IJ SOLN
INTRAMUSCULAR | Status: DC | PRN
  Administered 2019-02-10 (×6): 5 mL via PERINEURAL

## 2019-02-10 MED ORDER — ONDANSETRON HCL 4 MG/2ML IJ SOLN: 4.0000 mg | Freq: Once | INTRAMUSCULAR | Status: DC | PRN

## 2019-02-10 MED ORDER — DEXAMETHASONE SODIUM PHOSPHATE 10 MG/ML IJ SOLN
INTRAMUSCULAR | Status: AC
  Filled 2019-02-10: qty 1

## 2019-02-10 MED ORDER — KETOROLAC TROMETHAMINE 15 MG/ML IJ SOLN
15.0000 mg | INTRAMUSCULAR | Status: AC
  Administered 2019-02-10: 10:00:00 15 mg via INTRAVENOUS

## 2019-02-10 MED ORDER — GABAPENTIN 100 MG PO CAPS
ORAL_CAPSULE | ORAL | Status: AC
  Filled 2019-02-10: qty 1

## 2019-02-10 MED ORDER — MEPERIDINE HCL 25 MG/ML IJ SOLN: 6.2500 mg | INTRAMUSCULAR | Status: DC | PRN

## 2019-02-10 MED ORDER — KETOROLAC TROMETHAMINE 15 MG/ML IJ SOLN
INTRAMUSCULAR | Status: AC
  Filled 2019-02-10: qty 1

## 2019-02-10 MED ORDER — CLONIDINE HCL (ANALGESIA) 100 MCG/ML EP SOLN
EPIDURAL | Status: DC | PRN
  Administered 2019-02-10: 100 ug

## 2019-02-10 MED ORDER — FENTANYL CITRATE (PF) 100 MCG/2ML IJ SOLN
50.0000 ug | INTRAMUSCULAR | Status: DC | PRN
  Administered 2019-02-10 (×2): 100 ug via INTRAVENOUS

## 2019-02-10 MED ORDER — ACETAMINOPHEN 325 MG PO TABS: 325.0000 mg | ORAL_TABLET | ORAL | Status: DC | PRN

## 2019-02-10 MED ORDER — LIDOCAINE HCL (CARDIAC) PF 100 MG/5ML IV SOSY
PREFILLED_SYRINGE | INTRAVENOUS | Status: DC | PRN
  Administered 2019-02-10: 100 mg via INTRAVENOUS

## 2019-02-10 MED ORDER — DEXAMETHASONE SODIUM PHOSPHATE 10 MG/ML IJ SOLN
INTRAMUSCULAR | Status: DC | PRN
  Administered 2019-02-10: 5 mg via INTRAVENOUS

## 2019-02-10 MED ORDER — TECHNETIUM TC 99M SULFUR COLLOID FILTERED
1.0000 | Freq: Once | INTRAVENOUS | Status: AC | PRN
  Administered 2019-02-10: 11:00:00 1 via INTRADERMAL

## 2019-02-10 MED ORDER — GABAPENTIN 100 MG PO CAPS
100.0000 mg | ORAL_CAPSULE | ORAL | Status: AC
  Administered 2019-02-10: 100 mg via ORAL

## 2019-02-10 SURGICAL SUPPLY — 63 items
APPLIER CLIP 9.375 MED OPEN (MISCELLANEOUS) ×2
BINDER BREAST LRG (GAUZE/BANDAGES/DRESSINGS) IMPLANT
BINDER BREAST MEDIUM (GAUZE/BANDAGES/DRESSINGS) ×1 IMPLANT
BINDER BREAST XLRG (GAUZE/BANDAGES/DRESSINGS) IMPLANT
BINDER BREAST XXLRG (GAUZE/BANDAGES/DRESSINGS) IMPLANT
BLADE SURG 15 STRL LF DISP TIS (BLADE) ×1 IMPLANT
BLADE SURG 15 STRL SS (BLADE) ×2
CANISTER SUC SOCK COL 7IN (MISCELLANEOUS) IMPLANT
CANISTER SUCT 1200ML W/VALVE (MISCELLANEOUS) IMPLANT
CHLORAPREP W/TINT 26 (MISCELLANEOUS) ×2 IMPLANT
CLIP APPLIE 9.375 MED OPEN (MISCELLANEOUS) IMPLANT
CLIP VESOCCLUDE SM WIDE 6/CT (CLIP) ×2 IMPLANT
COVER BACK TABLE REUSABLE LG (DRAPES) ×2 IMPLANT
COVER MAYO STAND REUSABLE (DRAPES) ×2 IMPLANT
COVER PROBE W GEL 5X96 (DRAPES) ×2 IMPLANT
COVER WAND RF STERILE (DRAPES) IMPLANT
DECANTER SPIKE VIAL GLASS SM (MISCELLANEOUS) IMPLANT
DERMABOND ADVANCED (GAUZE/BANDAGES/DRESSINGS) ×1
DERMABOND ADVANCED .7 DNX12 (GAUZE/BANDAGES/DRESSINGS) ×1 IMPLANT
DRAPE LAPAROSCOPIC ABDOMINAL (DRAPES) ×2 IMPLANT
DRAPE UTILITY XL STRL (DRAPES) ×2 IMPLANT
ELECT COATED BLADE 2.86 ST (ELECTRODE) ×2 IMPLANT
ELECT REM PT RETURN 9FT ADLT (ELECTROSURGICAL) ×2
ELECTRODE REM PT RTRN 9FT ADLT (ELECTROSURGICAL) ×1 IMPLANT
GLOVE BIO SURGEON STRL SZ7 (GLOVE) ×4 IMPLANT
GLOVE BIOGEL PI IND STRL 6.5 (GLOVE) IMPLANT
GLOVE BIOGEL PI IND STRL 7.5 (GLOVE) ×1 IMPLANT
GLOVE BIOGEL PI INDICATOR 6.5 (GLOVE) ×1
GLOVE BIOGEL PI INDICATOR 7.5 (GLOVE) ×2
GLOVE ECLIPSE 6.5 STRL STRAW (GLOVE) ×2 IMPLANT
GOWN STRL REUS W/ TWL LRG LVL3 (GOWN DISPOSABLE) ×2 IMPLANT
GOWN STRL REUS W/ TWL XL LVL3 (GOWN DISPOSABLE) IMPLANT
GOWN STRL REUS W/TWL LRG LVL3 (GOWN DISPOSABLE) ×3
GOWN STRL REUS W/TWL XL LVL3 (GOWN DISPOSABLE) ×1
HEMOSTAT ARISTA ABSORB 3G PWDR (HEMOSTASIS) IMPLANT
ILLUMINATOR WAVEGUIDE N/F (MISCELLANEOUS) IMPLANT
KIT MARKER MARGIN INK (KITS) ×2 IMPLANT
LIGHT WAVEGUIDE WIDE FLAT (MISCELLANEOUS) IMPLANT
NDL HYPO 25X1 1.5 SAFETY (NEEDLE) ×1 IMPLANT
NDL SAFETY ECLIPSE 18X1.5 (NEEDLE) IMPLANT
NEEDLE HYPO 18GX1.5 SHARP (NEEDLE)
NEEDLE HYPO 25X1 1.5 SAFETY (NEEDLE) ×2 IMPLANT
NS IRRIG 1000ML POUR BTL (IV SOLUTION) ×1 IMPLANT
PACK BASIN DAY SURGERY FS (CUSTOM PROCEDURE TRAY) ×2 IMPLANT
PENCIL SMOKE EVACUATOR (MISCELLANEOUS) ×2 IMPLANT
RETRACTOR ONETRAX LX 90X20 (MISCELLANEOUS) ×1 IMPLANT
SLEEVE SCD COMPRESS KNEE MED (MISCELLANEOUS) ×2 IMPLANT
SPONGE LAP 4X18 RFD (DISPOSABLE) ×3 IMPLANT
STRIP CLOSURE SKIN 1/2X4 (GAUZE/BANDAGES/DRESSINGS) ×2 IMPLANT
SUT ETHILON 2 0 FS 18 (SUTURE) IMPLANT
SUT MNCRL AB 4-0 PS2 18 (SUTURE) ×2 IMPLANT
SUT MON AB 5-0 PS2 18 (SUTURE) IMPLANT
SUT SILK 2 0 SH (SUTURE) ×1 IMPLANT
SUT VIC AB 2-0 SH 27 (SUTURE) ×2
SUT VIC AB 2-0 SH 27XBRD (SUTURE) ×1 IMPLANT
SUT VIC AB 3-0 SH 27 (SUTURE) ×1
SUT VIC AB 3-0 SH 27X BRD (SUTURE) ×1 IMPLANT
SUT VIC AB 5-0 PS2 18 (SUTURE) ×1 IMPLANT
SYR CONTROL 10ML LL (SYRINGE) ×2 IMPLANT
TOWEL GREEN STERILE FF (TOWEL DISPOSABLE) ×2 IMPLANT
TRAY FAXITRON CT DISP (TRAY / TRAY PROCEDURE) ×3 IMPLANT
TUBE CONNECTING 20X1/4 (TUBING) ×1 IMPLANT
YANKAUER SUCT BULB TIP NO VENT (SUCTIONS) ×1 IMPLANT

## 2019-02-10 NOTE — Anesthesia Postprocedure Evaluation (Signed)
Anesthesia Post Note  Patient: Judith Dixon  Procedure(s) Performed: LEFT BREAST LUMPECTOMY WITH RADIOACTIVE SEED AND LEFT AXILLARY SENTINEL LYMPH NODE BIOPSY (Left Breast)     Patient location during evaluation: PACU Anesthesia Type: General Level of consciousness: awake and alert and oriented Pain management: pain level controlled Vital Signs Assessment: post-procedure vital signs reviewed and stable Respiratory status: spontaneous breathing, nonlabored ventilation and respiratory function stable Cardiovascular status: blood pressure returned to baseline and stable Postop Assessment: no apparent nausea or vomiting Anesthetic complications: no    Last Vitals:  Vitals:   02/10/19 1345 02/10/19 1400  BP: (!) 97/59 92/60  Pulse: 64 63  Resp: 16 12  Temp:    SpO2: 97% 97%    Last Pain:  Vitals:   02/10/19 1400  TempSrc:   PainSc: 4                  Ettore Trebilcock A.

## 2019-02-10 NOTE — Anesthesia Preprocedure Evaluation (Signed)
Anesthesia Evaluation  Patient identified by MRN, date of birth, ID band Patient awake    Reviewed: Allergy & Precautions, NPO status , Patient's Chart, lab work & pertinent test results  History of Anesthesia Complications (+) PROLONGED EMERGENCE and history of anesthetic complications  Airway Mallampati: I       Dental no notable dental hx. (+) Teeth Intact   Pulmonary neg pulmonary ROS,    Pulmonary exam normal breath sounds clear to auscultation       Cardiovascular negative cardio ROS Normal cardiovascular exam Rhythm:Regular Rate:Normal     Neuro/Psych negative neurological ROS  negative psych ROS   GI/Hepatic negative GI ROS, Neg liver ROS,   Endo/Other  negative endocrine ROS  Renal/GU negative Renal ROS  negative genitourinary   Musculoskeletal negative musculoskeletal ROS (+)   Abdominal Normal abdominal exam  (+)   Peds  Hematology negative hematology ROS (+)   Anesthesia Other Findings   Reproductive/Obstetrics                             Anesthesia Physical Anesthesia Plan  ASA: II  Anesthesia Plan: General   Post-op Pain Management:  Regional for Post-op pain   Induction: Intravenous  PONV Risk Score and Plan: 3 and Ondansetron, Dexamethasone and Midazolam  Airway Management Planned: LMA  Additional Equipment: None  Intra-op Plan:   Post-operative Plan: Extubation in OR  Informed Consent: I have reviewed the patients History and Physical, chart, labs and discussed the procedure including the risks, benefits and alternatives for the proposed anesthesia with the patient or authorized representative who has indicated his/her understanding and acceptance.     Dental advisory given  Plan Discussed with: CRNA  Anesthesia Plan Comments:         Anesthesia Quick Evaluation

## 2019-02-10 NOTE — Discharge Instructions (Signed)
Next Dose of Tylenol and Ibuprofen at 4:00 PM  [employer] Phone Number [phone]   POST OP INSTRUCTIONS Take 400 mg of ibuprofen every 8 hours or 650 mg tylenol every 6 hours for next 72 hours then as needed. Use ice several times daily also. Always review your discharge instruction sheet given to you by the facility where your surgery was performed.  IF YOU HAVE DISABILITY OR FAMILY LEAVE FORMS, YOU MUST BRING THEM TO THE OFFICE FOR PROCESSING.  DO NOT GIVE THEM TO YOUR DOCTOR.  1. A prescription for pain medication may be given to you upon discharge.  Take your pain medication as prescribed, if needed.  If narcotic pain medicine is not needed, then you may take acetaminophen (Tylenol), naprosyn (Alleve) or ibuprofen (Advil) as needed. 2. Take your usually prescribed medications unless otherwise directed 3. If you need a refill on your pain medication, please contact your pharmacy.  They will contact our office to request authorization.  Prescriptions will not be filled after 5pm or on week-ends. 4. You should eat very light the first 24 hours after surgery, such as soup, crackers, pudding, etc.  Resume your normal diet the day after surgery. 5. Most patients will experience some swelling and bruising in the breast.  Ice packs and a good support bra will help.  Wear the breast binder provided or a sports bra for 72 hours day and night.  After that wear a sports bra during the day until you return to the office. Swelling and bruising can take several days to resolve.  6. It is common to experience some constipation if taking pain medication after surgery.  Increasing fluid intake and taking a stool softener will usually help or prevent this problem from occurring.  A mild laxative (Milk of Magnesia or Miralax) should be taken according to package directions if there are no bowel movements after 48 hours. 7. Unless discharge instructions indicate otherwise, you may remove  your bandages 48 hours after surgery and you may shower at that time.  You may have steri-strips (small skin tapes) in place directly over the incision.  These strips should be left on the skin for 7-10 days and will come off on their own.  If your surgeon used skin glue on the incision, you may shower in 24 hours.  The glue will flake off over the next 2-3 weeks.  Any sutures or staples will be removed at the office during your follow-up visit. 8. ACTIVITIES:  You may resume regular daily activities (gradually increasing) beginning the next day.  Wearing a good support bra or sports bra minimizes pain and swelling.  You may have sexual intercourse when it is comfortable. a. You may drive when you no longer are taking prescription pain medication, you can comfortably wear a seatbelt, and you can safely maneuver your car and apply brakes. b. RETURN TO WORK:  ______________________________________________________________________________________ 9. You should see your doctor in the office for a follow-up appointment approximately two weeks after your surgery.  Your doctor's nurse will typically make your follow-up appointment when she calls you with your pathology report.  Expect your pathology report 3-4 business days after your surgery.  You may call to check if you do not hear from [place] after three days. 10. OTHER INSTRUCTIONS: _______________________________________________________________________________________________ _____________________________________________________________________________________________________________________________________ _____________________________________________________________________________________________________________________________________ _____________________________________________________________________________________________________________________________________  WHEN TO CALL DR WAKEFIELD: 1. Fever over 101.0 2. Nausea and/or vomiting. 3. Extreme  swelling or bruising. 4. Continued bleeding from incision. 5. Increased pain, redness, or  drainage from the incision.  The clinic staff is available to answer your questions during regular business hours.  Please don't hesitate to call and ask to speak to one of the nurses for clinical concerns.  If you have a medical emergency, go to the nearest emergency room or call 911.  A surgeon from [facility] is always on call at the hospital.  For further questions, please visit [email or web address]   Post Anesthesia Home Care Instructions  Activity: Get plenty of rest for the remainder of the day. A responsible individual must stay with you for 24 hours following the procedure.  For the next 24 hours, DO NOT: -Drive a car -Paediatric nurse -Drink alcoholic beverages -Take any medication unless instructed by your physician -Make any legal decisions or sign important papers.  Meals: Start with liquid foods such as gelatin or soup. Progress to regular foods as tolerated. Avoid greasy, spicy, heavy foods. If nausea and/or vomiting occur, drink only clear liquids until the nausea and/or vomiting subsides. Call your physician if vomiting continues.  Special Instructions/Symptoms: Your throat may feel dry or sore from the anesthesia or the breathing tube placed in your throat during surgery. If this causes discomfort, gargle with warm salt water. The discomfort should disappear within 24 hours.  If you had a scopolamine patch placed behind your ear for the management of post- operative nausea and/or vomiting:  1. The medication in the patch is effective for 72 hours, after which it should be removed.  Wrap patch in a tissue and discard in the trash. Wash hands thoroughly with soap and water. 2. You may remove the patch earlier than 72 hours if you experience unpleasant side effects which may include dry mouth, dizziness or visual disturbances. 3. Avoid touching the patch.  Wash your hands with soap and water after contact with the patch.

## 2019-02-10 NOTE — Progress Notes (Signed)
Nuc med inj performed by nuc med staff. Pt tol procedure well. VSS. Emotional support provided.

## 2019-02-10 NOTE — H&P (Signed)
59 yof referred by Dr Luan Pulling for new left breast cancer. she has fh in a maternal aunt and a maternal cousin in their 82s. she has no prior breast issues. she had no mass or dc. she lives in [place] and her husband is Larren Yeske who I know well. she works as Medical illustrator at [employer]. she had screening mm that showed a left upper outer quadrant distortion. breasts are c density. US showed 6 mm mass, neg axilla. 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 she is here to discuss options   Past Surgical History Illene Regulus, CMA; 01/13/2019 10:36 AM) Breast Biopsy  Left. Oral Surgery   Diagnostic Studies History Illene Regulus, CMA; 01/13/2019 10:36 AM) Colonoscopy  never Mammogram  within last year Pap Smear  1-5 years ago  Allergies Illene Regulus, CMA; 01/13/2019 10:37 AM) No Known Drug Allergies [01/13/2019]:  Medication History (Alisha Spillers, CMA; 01/13/2019 10:37 AM) Multivitamin (Oral) Active. Medications Reconciled  Social History Illene Regulus, CMA; 01/13/2019 10:36 AM) Alcohol use  Occasional alcohol use. Caffeine use  Tea. No drug use  Tobacco use  Never smoker.  Family History Illene Regulus, [place]; 01/13/2019 10:36 AM) Arthritis  Father. Colon Polyps  Father, Mother. Heart disease in female family member before age 30  Hypertension  Father, Mother. Prostate Cancer  Father. Thyroid problems  Mother.  Pregnancy / Birth History Illene Regulus, CMA; 01/13/2019 10:36 AM) Age at menarche  59 years. Age of menopause  51-55 Contraceptive History  Oral contraceptives. Gravida  2 Irregular periods  Length (months) of breastfeeding  7-12 Maternal age  44-25 Para  2  Other Problems Illene Regulus, CMA; 01/13/2019 10:36 AM) Back Pain  Breast Cancer  Hemorrhoids  Migraine Headache    Review of Systems (Alisha Spillers CMA;  01/13/2019 10:36 AM) General Not Present- Appetite Loss, Chills, Fatigue, Fever, Night Sweats, Weight Gain and Weight Loss. Skin Not Present- Change in Wart/Mole, Dryness, Hives, Jaundice, New Lesions, Non-Healing Wounds, Rash and Ulcer. HEENT Present- Ringing in the Ears, Seasonal Allergies and Wears glasses/contact lenses. Not Present- Earache, Hearing Loss, Hoarseness, Nose Bleed, Oral Ulcers, Sinus Pain, Sore Throat, Visual Disturbances and Yellow Eyes. Respiratory Not Present- Bloody sputum, Chronic Cough, Difficulty Breathing, Snoring and Wheezing. Breast Not Present- Breast Mass, Breast Pain, Nipple Discharge and Skin Changes. Cardiovascular Not Present- Chest Pain, Difficulty Breathing Lying Down, Leg Cramps, Palpitations, Rapid Heart Rate, Shortness of Breath and Swelling of Extremities. Gastrointestinal Present- Hemorrhoids. Not Present- Abdominal Pain, Bloating, Bloody Stool, Change in Bowel Habits, Chronic diarrhea, Constipation, Difficulty Swallowing, Excessive gas, Gets full quickly at meals, Indigestion, Nausea, Rectal Pain and Vomiting. Female Genitourinary Not Present- Frequency, Nocturia, Painful Urination, Pelvic Pain and Urgency. Musculoskeletal Not Present- Back Pain, Joint Pain, Joint Stiffness, Muscle Pain, Muscle Weakness and Swelling of Extremities. Neurological Present- Headaches. Not Present- Decreased Memory, Fainting, Numbness, Seizures, Tingling, Tremor, Trouble walking and Weakness. Psychiatric Not Present- Anxiety, Bipolar, Change in Sleep Pattern, Depression, Fearful and Frequent crying. Endocrine Present- Hair Changes. Not Present- Cold Intolerance, Excessive Hunger, Heat Intolerance, Hot flashes and New Diabetes. Hematology Not Present- Blood Thinners, Easy Bruising, Excessive bleeding, Gland problems, HIV and Persistent Infections.  Vitals (Alisha Spillers CMA; 01/13/2019 10:37 AM) 01/13/2019 10:36 AM Weight: 141 lb Height: 64in Body Surface Area: 1.69 m Body  Mass Index: 24.2 kg/m  Temp.: 73F(Oral)  Pulse: 90 (Regular)  BP: 124/74 (Sitting, Left Arm, Standard) Physical  Exam Rolm Bookbinder MD; 01/13/2019 11:48 AM) General Mental Status-Alert. Orientation-Oriented X3. Eye Sclera/Conjunctiva - Bilateral-No scleral icterus. Chest and Lung Exam Chest and lung exam reveals -quiet, even and easy respiratory effort with no use of accessory muscles. Breast Nipples-No Discharge. Breast Lump-No Palpable Breast Mass. Neurologic Neurologic evaluation reveals -alert and oriented x 3 with no impairment of recent or remote memory. Lymphatic Head & Neck General Head & Neck Lymphatics: Bilateral - Description - Normal. Axillary General Axillary Region: Bilateral - Description - Normal. Note: no [place] adenopathy  Assessment & Plan Rolm Bookbinder MD; 01/13/2019 12:08 PM) BREAST CANCER OF UPPER-OUTER QUADRANT OF LEFT FEMALE BREAST (C50.412) left breast lumpectomy, left ax sn biopsy We discussed the staging and pathophysiology of breast cancer. We discussed all of the different options for treatment for breast cancer including surgery, chemotherapy, radiation therapy, Herceptin, and antiestrogen therapy. We discussed a sentinel lymph node biopsy as she does not appear to having lymph node involvement right now. We discussed the performance of that with injection of radioactive tracer. We discussed that there is a chance of having a positive node with a sentinel lymph node biopsy and we will await the permanent pathology to make any other first further decisions in terms of her treatment. We discussed up to a 5% risk lifetime of chronic shoulder pain as well as lymphedema associated with a sentinel lymph node biopsy. We discussed the options for treatment of the breast cancer which included lumpectomy versus a mastectomy. We discussed the performance of the lumpectomy with radioactive seed placement. We discussed a 5-10% chance of a  positive margin requiring reexcision in the operating room. We also discussed that she will need radiation therapy if she undergoes lumpectomy. The breast cannot undergo more radiation therapy in the same breast after lumpectomy in the future. We discussed mastectomy and the postoperative care for that as well. Mastectomy can be followed by reconstruction. The decision for lumpectomy vs mastectomy has no impact on decision for chemotherapy. Most mastectomy patients will not need radiation therapy. We discussed that there is no difference in her survival whether she undergoes lumpectomy with radiation therapy or antiestrogen therapy versus a mastectomy. There is also no real difference between her recurrence in the breast. We discussed the risks of operation including bleeding, infection, possible reoperation. She understands her further therapy will be based on what her stages at the time of her operation.

## 2019-02-10 NOTE — Progress Notes (Signed)
Assisted Dr. Hatchett with left, ultrasound guided, pectoralis block. Side rails up, monitors on throughout procedure. See vital signs in flow sheet. Tolerated Procedure well. 

## 2019-02-10 NOTE — Transfer of Care (Signed)
Immediate Anesthesia Transfer of Care Note  Patient: Judith Dixon  Procedure(s) Performed: LEFT BREAST LUMPECTOMY WITH RADIOACTIVE SEED AND LEFT AXILLARY SENTINEL LYMPH NODE BIOPSY (Left Breast)  Patient Location: PACU  Anesthesia Type:General  Level of Consciousness: awake, alert  and oriented  Airway & Oxygen Therapy: Patient Spontanous Breathing and Patient connected to face mask oxygen  Post-op Assessment: Report given to RN, Post -op Vital signs reviewed and stable and Patient moving all extremities X 4  Post vital signs: Reviewed and stable  Last Vitals:  Vitals Value Taken Time  BP    Temp    Pulse    Resp    SpO2      Last Pain:  Vitals:   02/10/19 0941  TempSrc: Oral  PainSc: 2       Patients Stated Pain Goal: 5 ([id number])  Complications: No apparent anesthesia complications

## 2019-02-10 NOTE — Interval H&P Note (Signed)
History and Physical Interval Note:  02/10/2019 11:26 AM  Judith Dixon  has presented today for surgery, with the diagnosis of LEFT BREAST CANCER.  The various methods of treatment have been discussed with the patient and family. After consideration of risks, benefits and other options for treatment, the patient has consented to  Procedure(s): LEFT BREAST LUMPECTOMY WITH RADIOACTIVE SEED AND LEFT AXILLARY SENTINEL LYMPH NODE BIOPSY (Left) as a surgical intervention.  The patient's history has been reviewed, patient examined, no change in status, stable for surgery.  I have reviewed the patient's chart and labs.  Questions were answered to the patient's satisfaction.     Rolm Bookbinder

## 2019-02-10 NOTE — Op Note (Signed)
Preoperative diagnosis:clinical stage I left breast cancer Postoperative diagnosis: Same as above Procedure:Leftbreastseed guided lumpectomy Leftdeep axillary sentinel node biopsy Surgeon: Dr. Serita Grammes Anesthesia: General  Specimens: 1.Leftbreast tissue marked with paint, containing seed  2. Additionalleft breast medial, lateral and inferior margins marked short superior, long lateral, double deep 3.Leftdeep axillary nodes Estimated blood [id number] cc Complications: None Drains: None Sponge and count was correct at completion Disposition to recovery in stable condition  Indications: 22 yof with new left breast cancer. she has fh in a maternal aunt and a maternal cousin in their 53s. she has no prior breast issues. she had no mass or dc. she had screening mm that showed a left upper outer quadrant distortion. breasts are c density. US showed 6 mm mass, neg axilla. 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 We discussed option and elected to proceed with lumpectomy/sn  Procedure: After informed consent was obtained shewas placed under general anesthesia without complication.  She had undergone a pectoral block and injected with technetium in standard periareolar fashion. She was given antibiotics. SCDs were in place. She was then prepped and draped in the standard sterile surgical fashion. A surgical timeout was then performed.  I then identified the seed in thelateral portion of the left breast.Imade aperiareolar incision to hide the scar later.Iinfiltrated Marcaine.I then tunneled to the seed using the neoprobe for guidance. I then removed the seed and the surrounding tissue with an attempt to get a clear margin. The seed was  on the specimen mammogram. The clip was not present. I discussed with radiology and reviewed mammogram again. I had attempted to take more medial tissue initially and the seed was in  lateral portion of specimen as the clip was not right at the seed.  I then took additional margins and there was no clip. I felt like the area should have been adequately excised at that point based on mammogram and seed so I elected to stop.   I then located the sentinel nodes in the low axilla.Ienteredthe axillary fascia. I then identified the sentinel node with a count of76. I excised this.There were several other nodes that I excised as well.The background radioactivity was less than 10% and there were no palpable nodes present.I then obtained hemostasis. I closed the fascia with 2-0 vicryl. I closed this with 2-0 vicryl 3-0 vicryl and 4-0 monocryl. I mobilized the breast tissue and closed this with 2-0 Vicryl, 3-0 Vicryl, and5-0 Monocryl.glue and steristrips were applied. She had a binder placed. She was extubated and transferred to recovery stable

## 2019-02-10 NOTE — Anesthesia Procedure Notes (Signed)
Procedure Name: LMA Insertion Date/Time: 02/10/2019 11:58 AM Performed by: Jonna Munro, CRNA Pre-anesthesia Checklist: Patient identified, Emergency Drugs available, Suction available, Patient being monitored and Timeout performed Patient Re-evaluated:Patient Re-evaluated prior to induction Oxygen Delivery Method: Circle system utilized Preoxygenation: Pre-oxygenation with 100% oxygen Induction Type: IV induction LMA: LMA flexible inserted LMA Size: 4.0 Number of attempts: 1 Placement Confirmation: positive ETCO2 and breath sounds checked- equal and bilateral Tube secured with: Tape Dental Injury: Teeth and Oropharynx as per pre-operative assessment

## 2019-02-10 NOTE — Anesthesia Procedure Notes (Signed)
Anesthesia Regional Block: Pectoralis block   Pre-Anesthetic Checklist: ,, timeout performed, Correct Patient, Correct Site, Correct Laterality, Correct Procedure, Correct Position, site marked, Risks and benefits discussed,  Surgical consent,  Pre-op evaluation,  At surgeon's request and post-op pain management  Laterality: Left and N/A  Prep: chloraprep       Needles:  Injection technique: Single-shot  Needle Type: Echogenic Stimulator Needle     Needle Length: 10cm  Needle Gauge: 21   Needle insertion depth: 3 cm   Additional Needles:   Procedures:,,,, ultrasound used (permanent image in chart),,,,  Narrative:  Start time: 02/10/2019 11:00 AM End time: 02/10/2019 11:10 AM Injection made incrementally with aspirations every 5 mL.  Performed by: Personally  Anesthesiologist: Lyn Hollingshead, MD

## 2019-02-11 ENCOUNTER — Encounter: Payer: Self-pay | Admitting: *Deleted

## 2019-02-12 LAB — SURGICAL PATHOLOGY

## 2019-02-15 ENCOUNTER — Telehealth: Payer: Self-pay | Admitting: Hematology and Oncology

## 2019-02-15 NOTE — Telephone Encounter (Signed)
Scheduled appt per 1/11 sch message - left message for patient to let her know apt is my chart visit.

## 2019-02-16 ENCOUNTER — Telehealth: Payer: Self-pay | Admitting: *Deleted

## 2019-02-16 NOTE — Telephone Encounter (Signed)
Received order for oncotype testing. Requisition faxed to pathology and [facility] °

## 2019-02-17 ENCOUNTER — Inpatient Hospital Stay: Payer: BC Managed Care – PPO | Admitting: Hematology and Oncology

## 2019-03-01 ENCOUNTER — Encounter: Payer: Self-pay | Admitting: *Deleted

## 2019-03-02 ENCOUNTER — Telehealth: Payer: Self-pay | Admitting: *Deleted

## 2019-03-02 ENCOUNTER — Encounter: Payer: Self-pay | Admitting: *Deleted

## 2019-03-02 DIAGNOSIS — Z170 Estrogen receptor positive status [ER+]: Secondary | ICD-10-CM

## 2019-03-02 DIAGNOSIS — C50412 Malignant neoplasm of upper-outer quadrant of left female breast: Secondary | ICD-10-CM

## 2019-03-02 NOTE — Telephone Encounter (Signed)
Spoke to pt regarding next steps and xrt. Pt denies further questions at this time. Discussed appt with Dr [name] on 2/2 and oncotype results of 13.

## 2019-03-02 NOTE — Telephone Encounter (Signed)
Received oncotype score of 13/4%. Physician team notified. Called pt with results, left vm stating chemo not recommended and that  Next step is XRT. Contact information provided for questions or needs. Referral sent for xrt with Dr. Sondra Come

## 2019-03-04 ENCOUNTER — Encounter: Payer: Self-pay | Admitting: *Deleted

## 2019-03-05 ENCOUNTER — Ambulatory Visit: Payer: BC Managed Care – PPO | Attending: General Surgery | Admitting: Physical Therapy

## 2019-03-05 ENCOUNTER — Other Ambulatory Visit: Payer: Self-pay

## 2019-03-05 ENCOUNTER — Encounter: Payer: Self-pay | Admitting: Physical Therapy

## 2019-03-05 DIAGNOSIS — M6281 Muscle weakness (generalized): Secondary | ICD-10-CM | POA: Diagnosis present

## 2019-03-05 DIAGNOSIS — R600 Localized edema: Secondary | ICD-10-CM | POA: Diagnosis present

## 2019-03-05 DIAGNOSIS — M25612 Stiffness of left shoulder, not elsewhere classified: Secondary | ICD-10-CM | POA: Insufficient documentation

## 2019-03-05 NOTE — Therapy (Signed)
[place], [place], [address] Phone: [phone]   Fax:  [phone]  Physical Therapy Evaluation  Patient Details  Name: Judith Dixon MRN: [id number] Date of Birth: 1960/04/13 Referring Provider (PT): Ave Filter Date: 03/05/2019  PT End of Session - 03/05/19 1234    Visit Number  1    Number of Visits  9    Date for PT Re-Evaluation  04/02/19    PT Start Time  0909   pt arrived late   PT Stop Time  1000    PT Time Calculation (min)  51 min    Activity Tolerance  Patient tolerated treatment well    Behavior During Therapy  [facility] for tasks assessed/performed       Past Medical History:  Diagnosis Date  . Allergy   . Breast cancer [facility])    left breast cancer diagnosed 01-05-2019- surgery 02-10-2019  . Complication of anesthesia    hard time waking up  . Family history of breast cancer   . Family history of colon cancer   . Family history of prostate cancer   . Motion sickness     Past Surgical History:  Procedure Laterality Date  . BREAST LUMPECTOMY WITH RADIOACTIVE SEED AND SENTINEL LYMPH NODE BIOPSY Left 02/10/2019   Procedure: LEFT BREAST LUMPECTOMY WITH RADIOACTIVE SEED AND LEFT AXILLARY SENTINEL LYMPH NODE BIOPSY;  Surgeon: Rolm Bookbinder, MD;  Location: [place];  Service: General;  Laterality: Left;  . ENDOMETRIAL ABLATION    . WISDOM TOOTH EXTRACTION     age [age]  . WRIST SURGERY      There were no vitals filed for this visit.   Subjective Assessment - 03/05/19 0913    Subjective  My left breast still has a hematoma and is swollen. I can move my arm but I feel pulling in my armpit. When I put my arm down to my side it feels like it doesn't go down all the way due to swelling.    Pertinent History  L brast cancer s/p L lumpectomy and SLNB (4 nodes all negative) on 02/10/19, pt will find out if she needs radiation on 03/15/19, does not need chemo     Patient Stated Goals  to get the swelling down    Currently in Pain?  No/denies         [facility] PT Assessment - 03/05/19 0001      Assessment   Medical Diagnosis  left breast cancer    Referring Provider (PT)  [name]    Onset Date/Surgical Date  02/10/19    Hand Dominance  Right    Prior Therapy  none      Precautions   Precautions  Other (comment)    Precaution Comments  at risk for lymphedema      Restrictions   Weight Bearing Restrictions  No      Balance Screen   Has the patient fallen in the past 6 months  No    Has the patient had a decrease in activity level because of a fear of falling?   No    Is the patient reluctant to leave their home because of a fear of falling?   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, constant reaching    Leisure  pt has not been exercising recently      Cognition   Overall Cognitive Status  Within Functional Limits for tasks assessed      Observation/Other Assessments   Observations  left breast with hematoma and fibrosis present, left axillary scar with fibrosis present      Posture/Postural Control   Posture/Postural Control  No significant limitations      ROM / Strength   AROM / PROM / Strength  AROM      AROM   AROM Assessment Site  Shoulder    Right/Left Shoulder  Right;Left    Right Shoulder Flexion  177 Degrees    Right Shoulder ABduction  177 Degrees    Right Shoulder Internal Rotation  62 Degrees    Right Shoulder External Rotation  90 Degrees    Left Shoulder Flexion  172 Degrees    Left Shoulder ABduction  162 Degrees    Left Shoulder Internal Rotation  75 Degrees    Left Shoulder External Rotation   90 Degrees        LYMPHEDEMA/ONCOLOGY QUESTIONNAIRE - 03/05/19 0930      Type   Cancer Type  left breast cancer      Surgeries   Lumpectomy Date  02/10/19    Sentinel Lymph Node Biopsy Date  02/10/19    Number Lymph Nodes Removed  4   all negative     Date Lymphedema/Swelling Started   Date  02/10/19      Treatment   Active Chemotherapy Treatment  No    Past Chemotherapy Treatment  No    Active Radiation Treatment  No   pt may need to have this, will be decided 03/15/19   Past Radiation Treatment  No    Current Hormone Treatment  No   might be taking arimidex   Past Hormone Therapy  No      What other symptoms do you have   Are you Having Heaviness or Tightness  Yes    Are you having Pain  Yes    Are you having pitting edema  No    Is it Hard or Difficult finding clothes that fit  No    Do you have infections  No    Is there Decreased scar mobility  Yes      Lymphedema Assessments   Lymphedema Assessments  Upper extremities      Right Upper Extremity Lymphedema   15 cm Proximal to Olecranon Process  29.2 cm    Olecranon Process  25 cm    15 cm Proximal to Ulnar Styloid Process  25.5 cm    Just Proximal to Ulnar Styloid Process  16 cm    Across Hand at [employer]  19.9 cm    At [place] of 2nd Digit  6.2 cm      Left Upper Extremity Lymphedema   15 cm Proximal to Olecranon Process  29 cm    Olecranon Process  24.5 cm    15 cm Proximal to Ulnar Styloid Process  23.5 cm    Just Proximal to Ulnar Styloid Process  15.5 cm    Across Hand at [employer]  18.7 cm    At [place] of 2nd Digit  6.2  cm          Katina Dung - 03/05/19 0001    Open a tight or new jar  Mild difficulty    Do heavy household chores (wash walls, wash floors)  Mild difficulty    Carry a shopping bag or briefcase  Mild difficulty    Wash your back  Mild difficulty    Use a knife to cut food  No difficulty    Recreational activities in which you take some force or impact through your arm,  shoulder, or hand (golf, hammering, tennis)  No difficulty    During the past week, to what extent has your arm, shoulder or hand problem interfered with your normal social activities with family, friends, neighbors, or groups?  Slightly    During the past week, to what extent has your arm, shoulder or hand problem limited your work or other regular daily activities  Slightly    Arm, shoulder, or hand pain.  Mild    Tingling (pins and needles) in your arm, shoulder, or hand  Severe    Difficulty Sleeping  Moderate difficulty    DASH Score  27.27 %        Outpatient Rehab from 03/05/2019 in [place]  Lymphedema Life Impact Scale Total Score  35.29 %      Objective measurements completed on examination: See above findings.              PT Education - 03/05/19 1233    Education Details  signs and symptoms of lymphedema, lymphedema risk reduction practices, importance of compression for swelling    Person(s) Educated  Patient    Methods  Explanation;Handout    Comprehension  Verbalized understanding          PT Long Term Goals - 03/05/19 1247      PT LONG TERM GOAL #1   Title  Pt will report a 50% reduction in left breast swelling to decrease risk of infection.    Time  4    Period  Weeks    Status  New    Target Date  04/02/19      PT LONG TERM GOAL #2   Title  Pt will report a 75% improvement in fibrosis around left axillary scar to allow improved comfort.    Time  4    Period  Weeks    Status  New    Target Date  04/02/19      PT LONG TERM GOAL #3   Title  Pt will demonstrate 170 degrees of left shoulder abduction to allow her to reach items and return to PLOF.    Baseline  162    Time  4    Period  Weeks    Status  New    Target Date  04/02/19      PT LONG TERM GOAL #4   Title  Pt will receive appropriate compression garments for long term management of edema.    Time  4    Period  Weeks    Status  New    Target  Date  04/02/19             Plan - 03/05/19 1035    Clinical Impression Statement  Pt presents to PT with left breast swelling due to a hematoma, decreased axillary scar mobility and decreased left shoulder ROM following a lumpectomy and SLNB on 02/10/19 for treatment of left breast cancer. Created a foam chip pack for  pt to wear in her bra against the hematoma to help decrease swelling and improve comfort. Also gave pt 1/2 grey foam to wear over axillary scar to keep bra from rubbing area. Educated pt about signs/symptoms of lymphedema and lymphedema risk reduction practices. Pt would benefit from skilled PT services to improve left shoulder abduction ROM, decrease L breast swelling, assist pt with obtaining appropriate compression garments, and instruct pt in a home exercise program.    Examination-Activity Limitations  Lift;Reach Overhead;Carry    Examination-Participation Restrictions  Cleaning    Stability/Clinical Decision Making  Evolving/Moderate complexity   pt most likely beginning radiation soon   Clinical Decision Making  Moderate    Rehab Potential  Good    PT Frequency  2x / week    PT Duration  4 weeks    PT Treatment/Interventions  ADLs/Self Care Home Management;Therapeutic exercise;Therapeutic activities;Patient/family education;Manual techniques;Manual lymph drainage;Compression bandaging;Scar mobilization;Passive range of motion;Taping;Vasopneumatic Device    PT Next Visit Plan  begin MLD to left breast and instruct pt, PROM to L shoulder (pulleys, ball), ask about chip pack and foam, send script for compression bra    Consulted and Agree with Plan of Care  Patient       Patient will benefit from skilled therapeutic intervention in order to improve the following deficits and impairments:  Pain, Increased fascial restricitons, Decreased strength, Decreased knowledge of use of DME, Decreased range of motion, Decreased scar mobility, Increased edema  Visit  Diagnosis: Localized edema  Stiffness of left shoulder, not elsewhere classified  Muscle weakness (generalized)     Problem List Patient Active Problem List   Diagnosis Date Noted  . Genetic testing 01/27/2019  . Family history of breast cancer   . Family history of prostate cancer   . Family history of colon cancer   . Malignant neoplasm of upper-outer quadrant of left breast in female, estrogen receptor positive ([place]) 01/13/2019  . Trigger finger of left thumb 03/03/2018  . Trigger middle finger of left hand 03/03/2018    Allyson Sabal [facility] 03/05/2019, 12:52 PM  [place], [place], [address] Phone: [phone]   Fax:  [phone]  Name: Zaraiah Burnard MRN: [id number] Date of Birth: 12/18/1960  Manus Gunning, PT 03/05/19 12:52 PM

## 2019-03-08 NOTE — Progress Notes (Signed)
HEMATOLOGY-ONCOLOGY MYCHART VIDEO VISIT PROGRESS NOTE  I connected with Judith Dixon on 03/09/2019 at  8:30 AM EST by MyChart video conference and verified that I am speaking with the correct person using two identifiers.  I discussed the limitations, risks, security and privacy concerns of performing an evaluation and management service by MyChart and the availability of in person appointments.  I also discussed with the patient that there may be a patient responsible charge related to this service. The patient expressed understanding and agreed to proceed.  Patient's Location: Home Physician Location: Clinic  CHIEF COMPLIANT: Follow-up s/p lumpectomy to review pathology  INTERVAL HISTORY: Judith Dixon is a 59 y.o. female with above-mentioned history of left breast cancer. She underwent a lumpectomy with Dr. 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 Oncotype testing showed a score of 13 with 4% chance of distant recurrence in 9 years with antiestrogen therapy alone. She presents over MyChart today to review the pathology report and discuss further treatment.   Oncology History  Malignant neoplasm of upper-outer quadrant of left breast in female, estrogen receptor positive ([place])  01/05/2019 Cancer Staging   Staging form: Breast, AJCC 8th Edition - Clinical stage from 01/05/2019: Stage IA (cT1b, cN0, cM0, G2, ER+, PR+, HER2-) - Signed by [name], NP on 01/13/2019   01/05/2019 Initial Diagnosis   Routine screening mammogram detected a 0.6cm architectural distortion in the left breast. Biopsy showed IDC with DCIS, grade 2, HER-2 - by FISH, ER+ 90%, PR+ 85%, Ki67 15%.    Genetic Testing   Negative genetic testing. No pathogenic variants identified. VUS in CHEK2 called c.8G>A identified on the [facility] Breast Cancer STAT Panel + Common  Hereditary Cancers Panel. The STAT Breast cancer panel offered by [facility] includes sequencing and rearrangement analysis for the following 9 genes:  ATM, BRCA1, BRCA2, CDH1, CHEK2, PALB2, PTEN, STK11 and TP53.  The Common Hereditary Cancers Panel offered by [facility] includes sequencing and/or deletion duplication testing of the following 48 genes: APC, ATM, AXIN2, BARD1, BMPR1A, BRCA1, BRCA2, BRIP1, CDH1, CDKN2A (p14ARF), CDKN2A (p16INK4a), CKD4, CHEK2, CTNNA1, DICER1, EPCAM (Deletion/duplication testing only), GREM1 (promoter region deletion/duplication testing only), KIT, MEN1, MLH1, MSH2, MSH3, MSH6, MUTYH, NBN, NF1, NHTL1, PALB2, PDGFRA, PMS2, POLD1, POLE, PTEN, RAD50, RAD51C, RAD51D, RNF43, SDHB, SDHC, SDHD, SMAD4, SMARCA4. STK11, TP53, TSC1, TSC2, and VHL.  The following genes were evaluated for sequence changes only: SDHA and HOXB13 c.251G>A variant only. The report date is 01/26/2019.    02/10/2019 Cancer Staging   Staging form: Breast, AJCC 8th Edition - Pathologic stage from 02/10/2019: Stage IA (pT1b, pN0, cM0, G2, ER+, PR+, HER2-) - Signed by [name], NP on 02/24/2019   02/10/2019 Surgery   Left lumpectomy [name]): IDC, grade 2, 0.6cm, with intermediate grade DCIS, clear margins, 4 left axillary lymph nodes negative    03/02/2019 Oncotype testing   Oncotype: 13/4%     Observations/Objective:  Healing and recovering from recent surgery.   Assessment Plan:  Malignant neoplasm of upper-outer quadrant of left breast in female, estrogen receptor positive ([place]) 02/10/2019:Left lumpectomy [name]): IDC, grade 2, 0.6cm, with intermediate grade DCIS, clear margins, 4 left axillary lymph nodes negative T1BN0 stage Ia Oncotype DX score 13: Low risk: 4%Risk of distant recurrence  Oncotype discussion: I discussed a low risk Oncotype score and that there is no benefit to systemic chemotherapy.  Treatment plan: 1.  Adjuvant  radiation 2.  Follow-up adjuvant antiestrogen therapy  Patient has been researching antiestrogen therapy related side effects using Lexi comp as well as other peer-reviewed journals.  She is very concerned about the long-term toxicities of anastrozole type medications in terms of risk of coronary artery disease high cholesterol, Alzheimer's etc.  Her family history is significant for Alzheimer's and she is worried about that. We will discussed the pros and cons of antiestrogen therapy when she is ready to start the end of radiation.  Return to clinic at the end of radiation to discuss antiestrogen therapy.  I discussed the assessment and treatment plan with the patient. The patient was provided an opportunity to ask questions and all were answered. The patient agreed with the plan and demonstrated an understanding of the instructions. The patient was advised to call back or seek an in-person evaluation if the symptoms worsen or if the condition fails to improve as anticipated.   I provided 20 minutes of face-to-face MyChart video visit time during this encounter.    Rulon Eisenmenger, MD 03/09/2019   I, Molly Dorshimer, am acting as scribe for [name], MD.  I have reviewed the above documentation for accuracy and completeness, and I agree with the above.

## 2019-03-09 ENCOUNTER — Inpatient Hospital Stay: Payer: BC Managed Care – PPO | Attending: Hematology and Oncology | Admitting: Hematology and Oncology

## 2019-03-09 ENCOUNTER — Other Ambulatory Visit: Payer: Self-pay

## 2019-03-09 ENCOUNTER — Ambulatory Visit: Payer: BC Managed Care – PPO | Attending: General Surgery

## 2019-03-09 DIAGNOSIS — R600 Localized edema: Secondary | ICD-10-CM | POA: Diagnosis present

## 2019-03-09 DIAGNOSIS — Z170 Estrogen receptor positive status [ER+]: Secondary | ICD-10-CM | POA: Diagnosis not present

## 2019-03-09 DIAGNOSIS — C50412 Malignant neoplasm of upper-outer quadrant of left female breast: Secondary | ICD-10-CM

## 2019-03-09 DIAGNOSIS — M25612 Stiffness of left shoulder, not elsewhere classified: Secondary | ICD-10-CM | POA: Diagnosis present

## 2019-03-09 DIAGNOSIS — M6281 Muscle weakness (generalized): Secondary | ICD-10-CM | POA: Diagnosis present

## 2019-03-09 NOTE — Patient Instructions (Signed)
Self manual lymph drainage: Perform this sequence once a day.  Only give enough pressure no your skin to make the skin move.  Diaphragmatic - Supine   Inhale through nose making navel move out toward hands. Exhale through puckered lips, hands follow navel in. Repeat _5__ times. Rest _10__ seconds between repeats.   Copyright  VHI. All rights reserved.  Hug yourself.  Do circles at your neck just above your collarbones.  Repeat this 10 times.  Axilla - One at a Time   Using full weight of flat hand and fingers at center of both armpits, make _10__ in-place circles.   Copyright  VHI. All rights reserved.  LEG: Inguinal Nodes Stimulation   With small finger side of hand against hip crease on involved side, gently perform circles at the crease. Repeat __10_ times.   Copyright  VHI. All rights reserved.  1) Axilla to Inguinal Nodes - Sweep   On involved side, sweep _4__ times from armpit along side of trunk to hip crease.  Now gently stretch skin from the involved side to the uninvolved side across the chest at the shoulder line.  Repeat that 4 times.  Draw an imaginary diagonal line from upper outer breast through the nipple area toward lower inner breast.  Direct fluid upward and inward from this line toward the pathway across your upper chest .  Do this in three rows to treat all of the upper inner breast tissue, and do each row 3-4x.      Direct fluid to treat all of lower outer breast tissue downward and outward toward      pathway that is aimed at the left groin.                    Finish by doing the pathways as described above going from your involved armpit to the same side groin and going across your upper chest from the involved shoulder to the uninvolved shoulder.  Repeat the steps above where you do circles in your left groin and right armpit. Copyright  VHI. All rights reserved.      

## 2019-03-09 NOTE — Assessment & Plan Note (Signed)
02/10/2019:Left lumpectomy Judith Dixon): IDC, grade 2, 0.6cm, with intermediate grade DCIS, clear margins, 4 left axillary lymph nodes negative T1BN0 stage Ia Oncotype DX score 13: Low risk: 4%Risk of distant recurrence  Oncotype discussion: I discussed a low risk Oncotype score and that there is no benefit to systemic chemotherapy.  Treatment plan: 1.  Adjuvant radiation 2.  Follow-up adjuvant antiestrogen therapy  Return to clinic at the end of radiation to start antiestrogen therapy.

## 2019-03-09 NOTE — Therapy (Signed)
[place], [place], [address] Phone: [phone]   Fax:  [phone]  Physical Therapy Treatment  Patient Details  Name: Judith Dixon MRN: [id number] Date of Birth: 02-23-1960 Referring Provider (PT): Ave Filter Date: 03/09/2019  PT End of Session - 03/09/19 1010    Visit Number  2    Number of Visits  9    Date for PT Re-Evaluation  04/02/19    PT Start Time  1010    PT Stop Time  1105    PT Time Calculation (min)  55 min    Activity Tolerance  Patient tolerated treatment well    Behavior During Therapy  [facility] for tasks assessed/performed       Past Medical History:  Diagnosis Date  . Allergy   . Breast cancer [facility])    left breast cancer diagnosed 01-05-2019- surgery 02-10-2019  . Complication of anesthesia    hard time waking up  . Family history of breast cancer   . Family history of colon cancer   . Family history of prostate cancer   . Motion sickness     Past Surgical History:  Procedure Laterality Date  . BREAST LUMPECTOMY WITH RADIOACTIVE SEED AND SENTINEL LYMPH NODE BIOPSY Left 02/10/2019   Procedure: LEFT BREAST LUMPECTOMY WITH RADIOACTIVE SEED AND LEFT AXILLARY SENTINEL LYMPH NODE BIOPSY;  Surgeon: Rolm Bookbinder, MD;  Location: [facility];  Service: General;  Laterality: Left;  . ENDOMETRIAL ABLATION    . WISDOM TOOTH EXTRACTION     age [age]  . WRIST SURGERY      There were no vitals filed for this visit.  Subjective Assessment - 03/09/19 1011    Subjective  Pt states that her pain is still present. She has been wearing her foam pad consistently which she is not sure if it helps or not.    Pertinent History  L brast cancer s/p L lumpectomy and SLNB (4 nodes all negative) on 02/10/19, pt will find out if she needs radiation on 03/15/19, does not need chemo    Patient Stated Goals  to get the swelling down    Currently in Pain?  Yes    Pain Score  2      Pain Location  Breast    Pain Orientation  Left    Pain Descriptors / Indicators  Aching    Pain Type  Surgical pain    Pain Onset  1 to 4 weeks ago    Pain Frequency  Constant    Aggravating Factors   prolonged standing/walking    Pain Relieving Factors  bringing her arm away from her body.                  Outpatient Re[facility] from [facility]  Lymphedema Life Impact Scale Total Score  35.29 %           [facility] Adult PT Treatment/Exercise - 03/09/19 0001      Manual Therapy   Manual Therapy  Manual Lymphatic Drainage (MLD);Myofascial release;Edema management    Edema Management  edema taping with 2 fanned I strips ove rlapping with head at L axilla    Myofascial Release  light longitudinal myofascial release at incision in the axilla with LUE in abduction/external rotation with hand behind the head.     Manual Lymphatic Drainage (MLD)  Pt was taught self MLD with short neck, bil axillary nodes, L inguinal nodes, establishing  the anterior inter-axillary anastomosis, L axillo-inguinal anastomosis, superior breast toward anterior inter-axillary anastomosis, inferior breast towrad L axillo-inguinal anastomosis then re-established anastomosis. re-worked all surfaces followed by 5 diaphragmatic breathes. Pt was educated throughout with an emphasis on skin stretch, direction, pressure using VC and hand over hand with demonstration for correct technique.              PT Education - 03/09/19 1105    Education Details  Pt will begin working on self MLD for the L breast at home. she will continue to wear foam pad and was educated on reasons to remove the tape including itching, redness or discomfort. Discussed calling MD if she has any symptoms that do not decrease or to call emergency services if she has difficulty breathing.    Person(s) Educated  Patient    Methods  Explanation;Demonstration;Tactile cues;Verbal cues;Handout     Comprehension  Verbalized understanding;Returned demonstration          PT Long Term Goals - 03/05/19 1247      PT LONG TERM GOAL #1   Title  Pt will report a 50% reduction in left breast swelling to decrease risk of infection.    Time  4    Period  Weeks    Status  New    Target Date  04/02/19      PT LONG TERM GOAL #2   Title  Pt will report a 75% improvement in fibrosis around left axillary scar to allow improved comfort.    Time  4    Period  Weeks    Status  New    Target Date  04/02/19      PT LONG TERM GOAL #3   Title  Pt will demonstrate 170 degrees of left shoulder abduction to allow her to reach items and return to PLOF.    Baseline  162    Time  4    Period  Weeks    Status  New    Target Date  04/02/19      PT LONG TERM GOAL #4   Title  Pt will receive appropriate compression garments for long term management of edema.    Time  4    Period  Weeks    Status  New    Target Date  04/02/19            Plan - 03/09/19 1010    Clinical Impression Statement  Pt presents to physical therapy with cntinued reports of pain in her L breast due to hematoma and tightness at her incision in her axilla. Light myofascial release decreased pulling at the axilla into end range flexion. Pt was educated on how to perform self MLD for the L breast with demonstration, voice cueing and tactile cueing in order to ensure accuracy. Pt L breast was taped over hematoma on the inferior/lateral L breast using 2 I strips fanned for edema taping. Pt was assisted with placeing her foam pad in the L breast cup over the hematoma which she reports decreased aching. Pt will benefit from continued POC at this time.    Examination-Activity Limitations  Lift;Reach Overhead;Carry    Examination-Participation Restrictions  Cleaning    PT Frequency  2x / week    PT Duration  4 weeks    PT Treatment/Interventions  ADLs/Self Care Home Management;Therapeutic exercise;Therapeutic  activities;Patient/family education;Manual techniques;Manual lymph drainage;Compression bandaging;Scar mobilization;Passive range of motion;Taping;Vasopneumatic Device    PT Next Visit Plan  assess self MLD of the L  breast, possibly provide komprex dense foam for pack , ask how tape was?    PT Home Exercise Plan  self MLD for the L breast    Consulted and Agree with Plan of Care  Patient       Patient will benefit from skilled therapeutic intervention in order to improve the following deficits and impairments:  Pain, Increased fascial restricitons, Decreased strength, Decreased knowledge of use of DME, Decreased range of motion, Decreased scar mobility, Increased edema  Visit Diagnosis: Localized edema  Stiffness of left shoulder, not elsewhere classified  Muscle weakness (generalized)     Problem List Patient Active Problem List   Diagnosis Date Noted  . Genetic testing 01/27/2019  . Family history of breast cancer   . Family history of prostate cancer   . Family history of colon cancer   . Malignant neoplasm of upper-outer quadrant of left breast in female, estrogen receptor positive ([place]) 01/13/2019  . Trigger finger of left thumb 03/03/2018  . Trigger middle finger of left hand 03/03/2018    Ander Purpura, PT 03/09/2019, 11:58 AM  [place], [place], [address] Phone: [phone]   Fax:  [phone]  Name: Judith Dixon MRN: [id number] Date of Birth: 05-27-1960

## 2019-03-11 NOTE — Progress Notes (Signed)
Location of Breast Cancer: Malignant neoplasm of upper-outer quadrant of left breast in female, estrogen receptor positive   Histology per Pathology Report: 01/05/19: Diagnosis Breast, left, needle core biopsy, 3 o'clock, 3cmfn - INVASIVE DUCTAL CARCINOMA. - DUCTAL CARCINOMA IN SITU.  Receptor Status: ER(90%), PR (85%), Her2-neu (negative), Ki-(15%)   Did patient present with symptoms (if so, please note symptoms) or was this found on screening mammography?: 01/05/2019:Routine screening mammogram detected a 0.6cm architectural distortion in the left breast.   Past/Anticipated interventions by surgeon, if any: 02/10/19: Procedure:Leftbreastseed guided lumpectomy Leftdeep axillary sentinel node biopsy Surgeon: Dr. Serita Grammes  Past/Anticipated interventions by medical oncology, if any: Chemotherapy Per Dr. [name] 03/09/19: Assessment Plan:  Malignant neoplasm of upper-outer quadrant of left breast in female, estrogen receptor positive ([place]) 02/10/2019:Left lumpectomy [name]): IDC, grade 2, 0.6cm, with intermediate grade DCIS, clear margins, 4 left axillary lymph nodes negative T1BN0 stage Ia Oncotype DX score 13: Low risk: 4%Risk of distant recurrence  Oncotype discussion: I discussed a low risk Oncotype score and that there is no benefit to systemic chemotherapy.  Treatment plan: 1.  Adjuvant radiation 2.  Follow-up adjuvant antiestrogen therapy Patient has been researching antiestrogen therapy related side effects using Lexi comp as well as other peer-reviewed journals.  She is very concerned about the long-term toxicities of anastrozole type medications in terms of risk of coronary artery disease high cholesterol, Alzheimer's etc.  Her family history is significant for Alzheimer's and she is worried about that. We will discussed the pros and cons of antiestrogen therapy when she is ready to start the end of radiation.  Return to clinic at the end of radiation to discuss  antiestrogen therapy.  Lymphedema issues, if any: Pt has swelling in breast and is attending PT. Pt reports breast with hematoma.   Pain issues, if any: Pt reports that she has "brachial" pain in shoulder and has numbness underneath upper arm.  SAFETY ISSUES:  Prior radiation? no  Pacemaker/ICD? no  Possible current pregnancy?no  Is the patient on methotrexate? no  Current Complaints / other details:  Pt presents today for f/u new with Dr. Sondra Come. Pt is anxious and "angry" re: breast CA.   BP 131/83 (BP Location: Left Arm, Patient Position: Sitting, Cuff Size: Normal)   Pulse 95   Temp 98.2 F (36.8 C)   Resp 20   Wt 145 lb (65.8 kg)   SpO2 98%   BMI 24.89 kg/m   Wt Readings from Last 3 Encounters:  03/15/19 145 lb (65.8 kg)  02/10/19 141 lb 8.6 oz (64.2 kg)  01/25/19 144 lb (65.3 kg)       Loma Sousa, RN 03/15/2019,8:17 AM

## 2019-03-12 ENCOUNTER — Ambulatory Visit: Payer: BC Managed Care – PPO | Admitting: Physical Therapy

## 2019-03-12 ENCOUNTER — Encounter: Payer: Self-pay | Admitting: Physical Therapy

## 2019-03-12 ENCOUNTER — Other Ambulatory Visit: Payer: Self-pay

## 2019-03-12 DIAGNOSIS — R600 Localized edema: Secondary | ICD-10-CM | POA: Diagnosis not present

## 2019-03-12 NOTE — Therapy (Signed)
[place], [place], [address] Phone: [phone]   Fax:  [phone]  Physical Therapy Treatment  Patient Details  Name: Judith Dixon MRN: [id number] Date of Birth: 06/19/1960 Referring Provider (PT): Ave Filter Date: 03/12/2019  PT End of Session - 03/12/19 [id number]    Visit Number  3    Number of Visits  9    Date for PT Re-Evaluation  04/02/19    PT Start Time  0807   pt arrived late   PT Stop Time  0855    PT Time Calculation (min)  48 min    Activity Tolerance  Patient tolerated treatment well    Behavior During Therapy  [facility] for tasks assessed/performed       Past Medical History:  Diagnosis Date  . Allergy   . Breast cancer [facility])    left breast cancer diagnosed 01-05-2019- surgery 02-10-2019  . Complication of anesthesia    hard time waking up  . Family history of breast cancer   . Family history of colon cancer   . Family history of prostate cancer   . Motion sickness     Past Surgical History:  Procedure Laterality Date  . BREAST LUMPECTOMY WITH RADIOACTIVE SEED AND SENTINEL LYMPH NODE BIOPSY Left 02/10/2019   Procedure: LEFT BREAST LUMPECTOMY WITH RADIOACTIVE SEED AND LEFT AXILLARY SENTINEL LYMPH NODE BIOPSY;  Surgeon: Rolm Bookbinder, MD;  Location: [facility];  Service: General;  Laterality: Left;  . ENDOMETRIAL ABLATION    . WISDOM TOOTH EXTRACTION     age [age]  . WRIST SURGERY      There were no vitals filed for this visit.  Subjective Assessment - 03/12/19 0808    Subjective  The breast is feeling softer. I have been wearing the chip pack.    Pertinent History  L brast cancer s/p L lumpectomy and SLNB (4 nodes all negative) on 02/10/19, pt will find out if she needs radiation on 03/15/19, does not need chemo    Patient Stated Goals  to get the swelling down    Currently in Pain?  No/denies    Pain Score  0-No pain                 Outpatient [facility]  Lymphedema Life Impact Scale Total Score  35.29 %           [facility] Adult PT Treatment/Exercise - 03/12/19 0001      Manual Therapy   Manual Therapy  Manual Lymphatic Drainage (MLD);Soft tissue mobilization;Myofascial release    Edema Management  cut large dot peach foam for pt to wear over area of hematoma and place chip pack on top of that    Soft tissue mobilization  along axillary scar and to area of hematoma    Myofascial Release  light longitudinal myofascial release at incision in the axilla with LUE in abduction/external rotation with hand behind the head.     Manual Lymphatic Drainage (MLD)  short neck, 5 diaphragmatic breaths, right axillary nodes and establishment of interaxillary pathway, left inguinal nodes and establishment of axillo inguinal pathway, left breast moving fluid towards pathways while instructing pt in correct technique then retracing all steps                  PT Long Term Goals - 03/05/19 1247      PT LONG TERM GOAL #1   Title  Pt will report a 50% reduction in left breast swelling to decrease risk of infection.    Time  4    Period  Weeks    Status  New    Target Date  04/02/19      PT LONG TERM GOAL #2   Title  Pt will report a 75% improvement in fibrosis around left axillary scar to allow improved comfort.    Time  4    Period  Weeks    Status  New    Target Date  04/02/19      PT LONG TERM GOAL #3   Title  Pt will demonstrate 170 degrees of left shoulder abduction to allow her to reach items and return to PLOF.    Baseline  162    Time  4    Period  Weeks    Status  New    Target Date  04/02/19      PT LONG TERM GOAL #4   Title  Pt will receive appropriate compression garments for long term management of edema.    Time  4    Period  Weeks    Status  New    Target Date  04/02/19            Plan - 03/12/19 [id number]    Clinical Impression  Statement  Continued with MLD today to left breast. Pt arrived with kinesiotape in place after last session and this was removed prior to beginning MLD. No redness or irritation noted. Did not reapply today to give skin a break. Pt was instructed in proper MLD technique. Cut large dot peach foam for pt to wear over area of hematoma under chip pack to help soften and decrease swelling in the area.    PT Frequency  2x / week    PT Duration  4 weeks    PT Treatment/Interventions  ADLs/Self Care Home Management;Therapeutic exercise;Therapeutic activities;Patient/family education;Manual techniques;Manual lymph drainage;Compression bandaging;Scar mobilization;Passive range of motion;Taping;Vasopneumatic Device    PT Next Visit Plan  assess self MLD of the L breast, see if peach foam helped, instruct pt in Strength ABC program - she is eager to return to exercise and instruct in progressing resistance    PT Home Exercise Plan  self MLD for the L breast    Consulted and Agree with Plan of Care  Patient       Patient will benefit from skilled therapeutic intervention in order to improve the following deficits and impairments:  Pain, Increased fascial restricitons, Decreased strength, Decreased knowledge of use of DME, Decreased range of motion, Decreased scar mobility, Increased edema  Visit Diagnosis: Localized edema     Problem List Patient Active Problem List   Diagnosis Date Noted  . Genetic testing 01/27/2019  . Family history of breast cancer   . Family history of prostate cancer   . Family history of colon cancer   . Malignant neoplasm of upper-outer quadrant of left breast in female, estrogen receptor positive ([place]) 01/13/2019  . Trigger finger of left thumb 03/03/2018  . Trigger middle finger of left hand 03/03/2018    Allyson Sabal [facility] 03/12/2019, 9:31 AM  [place], [place], [address] Phone:  [phone]   Fax:  [phone]  Name: Lislie Gohlke MRN: [id number] Date of Birth: 1960-11-05  Manus Gunning, PT 03/12/19 9:31 AM

## 2019-03-14 NOTE — Progress Notes (Signed)
Radiation Oncology         [phone] ________________________________  Name: Judith Dixon MRN: [id number]  Date: 03/15/2019  DOB: 02/22/1960  Re-Evaluation Note  CC: Judith Huxley, PA  [name], MD    ICD-10-CM   1. Malignant neoplasm of upper-outer quadrant of left breast in female, estrogen receptor positive ([place])  C50.412    Z17.0     Diagnosis: Stage IA (pT1b, N0) Left Breast UOQ, Invasive Ductal Carcinoma with DCIS, ER+ / PR+ / Her2-, Grade 2  Narrative: The patient returns today to discuss radiation treatment options. She was seen in consultation on 01/20/2019. At that time, we discussed that the patient would be an excellent candidate for breast conservation with radiation therapy directed at the left breast. She was scheduled to have sugery with Dr. [name] and follow-up with Dr. [name] to determine the potential benefit of chemotherapy.   Since consultation, she underwent genetic testing on 01/20/2019. Results were negative.  She underwent a colonoscopy on 01/25/2019, which was normal.  She opted to proceed with left breast lumpectomy with sentinal node biopsy on 02/10/2019 performed by Dr. [name]. Pathology from the procedure revealed grade 2 invasive ductal carcinoma and intermediate grade ductal carcinoma in situ. Resection margins were negative. Lymphovascular invasion was present. Four left axillary sentinel lymph nodes were biopsied and were all negative for carcinoma. Left medial, lateral, and inferior marginal excisions were all negative for carcinoma.  Oncotype DX was obtained on the final surgical sample and the recurrence score of 13 predicts a risk of recurrence outside the breast over the next 9 years of 4%, if the patient's only systemic therapy is an antiestrogen for 5 years. It also predicts no benefit from chemotherapy.  She was seen by Dr. [name] on 03/09/2019, during which time they discussed the low risk Oncotype score and how there would be  no benefit to systemic chemotherapy.  On review of systems, the patient reports significant discomfort in the axillary and upper arm area.  She also reports that this area has a numb feeling some associated tingling. She denies nipple discharge or bleeding and any other symptoms.  Patient did meet with Dr. [name] concerning her symptoms and according to the patient they discussed Neurontin or equivalent which the patient did not wish to consider at this time given the potential sedation issues.   Allergies:  has no active allergies.  Meds: Current Outpatient Medications  Medication Sig Dispense Refill  . Biotin 1 MG CAPS Take by mouth.    . EPINEPHrine 0.3 mg/0.3 mL IJ SOAJ injection USE AS DIRECTED FOR SEVERE ALLERGIC REACTIONS    . Multiple Vitamin (MULTIVITAMIN) tablet Take 1 tablet by mouth daily.    Marland Kitchen OVER THE COUNTER MEDICATION Natural vitamin for hair growth     No current facility-administered medications for this encounter.    Physical Findings: The patient is in no acute distress. Patient is alert and oriented.  weight is 145 lb (65.8 kg). Her temperature is 98.2 F (36.8 C). Her blood pressure is 131/83 and her pulse is 95. Her respiration is 20 and oxygen saturation is 98%.  No significant changes. Lungs are clear to auscultation bilaterally. Heart has regular rate and rhythm. No palpable cervical, supraclavicular, or axillary adenopathy. Abdomen soft, non-tender, normal bowel sounds. Right breast: no palpable mass, nipple discharge or bleeding. Left breast: Patient has significant induration in the upper outer aspect of the breast adjacent to her periareolar scar consistent with seroma or hematoma.  Axillary scar  healing well without signs of drainage or infection  Lab Findings: No results found for: WBC, HGB, HCT, MCV, PLT  Radiographic Findings: No results found.  Impression: Stage IA (pT1b, N0) Left Breast UOQ, Invasive Ductal Carcinoma with DCIS, ER+ / PR+ / Her2-,  Grade 2  The patient will be a good candidate for breast conservation with radiation therapy directed at the left breast.  She would appear to be a good candidate for hypofractionated accelerated radiation therapy and the patient is interested in this treatment approach.  In addition the patient is interested in partial breast radiation therapy and we discussed this treatment approach in detail.  The patient has brought in a couple of recent articles discussing that she would be a good candidate for this treatment approach and would desire to have this treatment approach if recommended.  I discussed with her that I will review the articles carefully to see if she may be a candidate for partial breast radiation therapy.  We did discuss that she would not be a good candidate for partial breast radiation therapy with the Mammo[facility] system given the interval since her surgery and likely anesthesia which she does not wish to consider at this time.  We also discussed the close surgical margin and that I would recommend a lumpectomy cavity boost as part of her overall treatment.  Plan: Patient was scheduled for CT simulation later today but given her hematoma/seroma would like to delay  this to to see if we can get some resolution of the hematoma.  she will return on February 22 for CT simulation.  -----------------------------------  Blair Promise, PhD, MD   This document serves as a record of services personally performed by Gery Pray, MD. It was created on his behalf by Clerance Lav, a trained medical scribe. The creation of this record is based on the scribe's personal observations and the provider's statements to them. This document has been checked and approved by the attending provider.

## 2019-03-15 ENCOUNTER — Encounter: Payer: Self-pay | Admitting: Radiation Oncology

## 2019-03-15 ENCOUNTER — Ambulatory Visit
Admission: RE | Admit: 2019-03-15 | Discharge: 2019-03-15 | Disposition: A | Discharge status: Home / Self Care | Payer: BC Managed Care – PPO | Source: Ambulatory Visit | Attending: Radiation Oncology | Admitting: Radiation Oncology

## 2019-03-15 ENCOUNTER — Other Ambulatory Visit: Payer: Self-pay

## 2019-03-15 VITALS — BP 131/83 | HR 95 | Temp 98.2°F | Resp 20 | Wt 145.0 lb

## 2019-03-15 DIAGNOSIS — Z923 Personal history of irradiation: Secondary | ICD-10-CM | POA: Diagnosis not present

## 2019-03-15 DIAGNOSIS — Z170 Estrogen receptor positive status [ER+]: Secondary | ICD-10-CM | POA: Insufficient documentation

## 2019-03-15 DIAGNOSIS — R202 Paresthesia of skin: Secondary | ICD-10-CM | POA: Diagnosis not present

## 2019-03-15 DIAGNOSIS — C50412 Malignant neoplasm of upper-outer quadrant of left female breast: Secondary | ICD-10-CM

## 2019-03-15 DIAGNOSIS — Z510 Encounter for antineoplastic radiation therapy: Secondary | ICD-10-CM | POA: Insufficient documentation

## 2019-03-15 NOTE — Patient Instructions (Signed)
Coronavirus (COVID-19) Are you at risk?  Are you at risk for the Coronavirus (COVID-19)?  To be considered HIGH RISK for Coronavirus (COVID-19), you have to meet the following criteria:  . Traveled to [place], [place], [place], [place] or [place]; or in the [place] to [place], [place], [place], or [place]; and have fever, cough, and shortness of breath within the last 2 weeks of travel OR . Been in close contact with a person diagnosed with COVID-19 within the last 2 weeks and have fever, cough, and shortness of breath . IF YOU DO NOT MEET THESE CRITERIA, YOU ARE CONSIDERED LOW RISK FOR COVID-19.  What to do if you are HIGH RISK for COVID-19?  . If you are having a medical emergency, call 911. . Seek medical care right away. Before you go to a doctor's office, urgent care or emergency department, call ahead and tell them about your recent travel, contact with someone diagnosed with COVID-19, and your symptoms. You should receive instructions from your physician's office regarding next steps of care.  . When you arrive at healthcare provider, tell the healthcare staff immediately you have returned from visiting [place], [place], [place], [place] or [place]; or traveled in the [place] to [place], [place], [place], or [place]; in the last two weeks or you have been in close contact with a person diagnosed with COVID-19 in the last 2 weeks.   . Tell the health care staff about your symptoms: fever, cough and shortness of breath. . After you have been seen by a medical provider, you will be either: o Tested for (COVID-19) and discharged home on quarantine except to seek medical care if symptoms worsen, and asked to  - Stay home and avoid contact with others until you get your results (4-5 days)  - Avoid travel on public transportation if possible (such as bus, train, or airplane) or o Sent to the Emergency Department by EMS for evaluation, COVID-19 testing, and possible  admission depending on your condition and test results.  What to do if you are LOW RISK for COVID-19?  Reduce your risk of any infection by using the same precautions used for avoiding the common cold or flu:  . Wash your hands often with soap and warm water for at least 20 seconds.  If soap and water are not readily available, use an alcohol-based hand sanitizer with at least 60% alcohol.  . If coughing or sneezing, cover your mouth and nose by coughing or sneezing into the elbow areas of your shirt or coat, into a tissue or into your sleeve (not your hands). . Avoid shaking hands with others and consider head nods or verbal greetings only. . Avoid touching your eyes, nose, or mouth with unwashed hands.  . Avoid close contact with people who are sick. . Avoid places or events with large numbers of people in one location, like concerts or sporting events. . Carefully consider travel plans you have or are making. . If you are planning any travel outside or inside the [place], visit the CDC's Travelers' Health webpage for the latest health notices. . If you have some symptoms but not all symptoms, continue to monitor at home and seek medical attention if your symptoms worsen. . If you are having a medical emergency, call 911.   ADDITIONAL HEALTHCARE OPTIONS FOR PATIENTS  [facility] / e-Visit: [email or web address]         [facility]: [phone]  [facility]: [phone]                   [facility]: [phone]   

## 2019-03-16 ENCOUNTER — Ambulatory Visit: Payer: BC Managed Care – PPO | Admitting: Physical Therapy

## 2019-03-16 ENCOUNTER — Encounter: Payer: Self-pay | Admitting: Physical Therapy

## 2019-03-16 DIAGNOSIS — R600 Localized edema: Secondary | ICD-10-CM | POA: Diagnosis not present

## 2019-03-16 NOTE — Therapy (Signed)
[place], [place], [address] Phone: [phone]   Fax:  [phone]  Physical Therapy Treatment  Patient Details  Name: Judith Dixon MRN: [id number] Date of Birth: [birth year] Referring Provider (PT): Ave Filter Date: 03/16/2019  PT End of Session - 03/16/19 0849    Visit Number  4    Number of Visits  9    Date for PT Re-Evaluation  04/02/19    PT Start Time  0807    PT Stop Time  0851    PT Time Calculation (min)  44 min    Activity Tolerance  Patient tolerated treatment well    Behavior During Therapy  [facility] for tasks assessed/performed       Past Medical History:  Diagnosis Date  . Allergy   . Breast cancer [facility])    left breast cancer diagnosed 01-05-2019- surgery 02-10-2019  . Complication of anesthesia    hard time waking up  . Family history of breast cancer   . Family history of colon cancer   . Family history of prostate cancer   . Motion sickness     Past Surgical History:  Procedure Laterality Date  . BREAST LUMPECTOMY WITH RADIOACTIVE SEED AND SENTINEL LYMPH NODE BIOPSY Left 02/10/2019   Procedure: LEFT BREAST LUMPECTOMY WITH RADIOACTIVE SEED AND LEFT AXILLARY SENTINEL LYMPH NODE BIOPSY;  Surgeon: Rolm Bookbinder, MD;  Location: [place];  Service: General;  Laterality: Left;  . ENDOMETRIAL ABLATION    . WISDOM TOOTH EXTRACTION     age [age]  . WRIST SURGERY      There were no vitals filed for this visit.  Subjective Assessment - 03/16/19 0808    Subjective  My breast is still hard. I get a yellow line under the skin.    Pertinent History  L brast cancer s/p L lumpectomy and SLNB (4 nodes all negative) on 02/10/19, pt will find out if she needs radiation on 03/15/19, does not need chemo    Patient Stated Goals  to get the swelling down    Currently in Pain?  No/denies    Pain Score  0-No pain                  Outpatient Rehab from  03/05/2019 in [place]  Lymphedema Life Impact Scale Total Score  35.29 %           [facility] Adult PT Treatment/Exercise - 03/16/19 0001      Manual Therapy   Soft tissue mobilization  along axillary scar and to area of hematoma, along lumpectomy scar in area of fibrosis - pt reported tenderness here    Myofascial Release  light longitudinal myofascial release at incision in the axilla with LUE in abduction/external rotation with hand behind the head.     Manual Lymphatic Drainage (MLD)  short neck, 5 diaphragmatic breaths, right axillary nodes and establishment of interaxillary pathway, left inguinal nodes and establishment of axillo inguinal pathway, left breast moving fluid towards pathways while instructing pt in correct technique then retracing all steps                  PT Long Term Goals - 03/05/19 1247      PT LONG TERM GOAL #1   Title  Pt will report a 50% reduction in left breast swelling to decrease risk of infection.    Time  4    Period  Weeks  Status  New    Target Date  04/02/19      PT LONG TERM GOAL #2   Title  Pt will report a 75% improvement in fibrosis around left axillary scar to allow improved comfort.    Time  4    Period  Weeks    Status  New    Target Date  04/02/19      PT LONG TERM GOAL #3   Title  Pt will demonstrate 170 degrees of left shoulder abduction to allow her to reach items and return to PLOF.    Baseline  162    Time  4    Period  Weeks    Status  New    Target Date  04/02/19      PT LONG TERM GOAL #4   Title  Pt will receive appropriate compression garments for long term management of edema.    Time  4    Period  Weeks    Status  New    Target Date  04/02/19            Plan - 03/16/19 [id number]    Clinical Impression Statement  Pt has been wearing large dot peach foam under chip pack. She reports that the hematoma seems to be getting smaller though it is still very hard. Educated  pt to continue with foam chip pack and peach foam and to wear a bra at night to continue to decrease hematoma. Also performed soft tissue mobilization to area of scar tissue around lumpectomy scar and lymph node dissection scar. Pt has tenderness in both of these areas.    PT Frequency  2x / week    PT Duration  4 weeks    PT Treatment/Interventions  ADLs/Self Care Home Management;Therapeutic exercise;Therapeutic activities;Patient/family education;Manual techniques;Manual lymph drainage;Compression bandaging;Scar mobilization;Passive range of motion;Taping;Vasopneumatic Device    PT Next Visit Plan  assess self MLD of the L breast, instruct pt in Strength ABC program - she is eager to return to exercise and instruct in progressing resistance    PT Home Exercise Plan  self MLD for the L breast    Consulted and Agree with Plan of Care  Patient       Patient will benefit from skilled therapeutic intervention in order to improve the following deficits and impairments:  Pain, Increased fascial restricitons, Decreased strength, Decreased knowledge of use of DME, Decreased range of motion, Decreased scar mobility, Increased edema  Visit Diagnosis: Localized edema     Problem List Patient Active Problem List   Diagnosis Date Noted  . Genetic testing 01/27/2019  . Family history of breast cancer   . Family history of prostate cancer   . Family history of colon cancer   . Malignant neoplasm of upper-outer quadrant of left breast in female, estrogen receptor positive ([place]) 01/13/2019  . Trigger finger of left thumb 03/03/2018  . Trigger middle finger of left hand 03/03/2018    Allyson Sabal [facility] 03/16/2019, 8:54 AM  [place], [place], [address] Phone: [phone]   Fax:  [phone]  Name: Alyssaann Donoso MRN: [id number] Date of Birth: [birth year]  Manus Gunning, PT 03/16/19 8:54 AM

## 2019-03-18 ENCOUNTER — Encounter: Payer: Self-pay | Admitting: *Deleted

## 2019-03-19 ENCOUNTER — Encounter: Payer: Self-pay | Admitting: Physical Therapy

## 2019-03-19 ENCOUNTER — Ambulatory Visit: Payer: BC Managed Care – PPO | Admitting: Physical Therapy

## 2019-03-19 ENCOUNTER — Encounter: Payer: Self-pay | Admitting: Hematology and Oncology

## 2019-03-19 ENCOUNTER — Other Ambulatory Visit: Payer: Self-pay

## 2019-03-19 DIAGNOSIS — R600 Localized edema: Secondary | ICD-10-CM | POA: Diagnosis not present

## 2019-03-19 NOTE — Therapy (Signed)
[place], [place], [address] Phone: [phone]   Fax:  [phone]  Physical Therapy Treatment  Patient Details  Name: Judith Dixon MRN: [id number] Date of Birth: March 20, 1960 Referring Provider (PT): [name]   Encounter Date: 03/19/2019  PT End of Session - 03/19/19 0954    Visit Number  5    Number of Visits  9    Date for PT Re-Evaluation  04/02/19    PT Start Time  0910   pt arrived late   PT Stop Time  0953    PT Time Calculation (min)  43 min    Activity Tolerance  Patient tolerated treatment well    Behavior During Therapy  [facility] for tasks assessed/performed       Past Medical History:  Diagnosis Date  . Allergy   . Breast cancer [facility])    left breast cancer diagnosed 01-05-2019- surgery 02-10-2019  . Complication of anesthesia    hard time waking up  . Family history of breast cancer   . Family history of colon cancer   . Family history of prostate cancer   . Motion sickness     Past Surgical History:  Procedure Laterality Date  . BREAST LUMPECTOMY WITH RADIOACTIVE SEED AND SENTINEL LYMPH NODE BIOPSY Left 02/10/2019   Procedure: LEFT BREAST LUMPECTOMY WITH RADIOACTIVE SEED AND LEFT AXILLARY SENTINEL LYMPH NODE BIOPSY;  Surgeon: Rolm Bookbinder, MD;  Location: [place];  Service: General;  Laterality: Left;  . ENDOMETRIAL ABLATION    . WISDOM TOOTH EXTRACTION     age [age]  . WRIST SURGERY      There were no vitals filed for this visit.  Subjective Assessment - 03/19/19 0911    Subjective  I had my second Covid shot on Wed morning. I got up Thursday and didn't feel well. I had to go in to work. Last night I slept for a long time and I did not have my bra on. I can't tell any difference with my swelling since not sleeping with the bra on.    Pertinent History  L brast cancer s/p L lumpectomy and SLNB (4 nodes all negative) on 02/10/19, pt will find out if she needs  radiation on 03/15/19, does not need chemo    Patient Stated Goals  to get the swelling down    Currently in Pain?  No/denies    Pain Score  0-No pain                  Outpatient Rehab from 03/05/2019 in [place]  Lymphedema Life Impact Scale Total Score  35.29 %           [facility] Adult PT Treatment/Exercise - 03/19/19 0001      Manual Therapy   Soft tissue mobilization  along axillary scar and to area of hematoma, along lumpectomy scar in area of fibrosis - pt has less tenderness today    Myofascial Release  light longitudinal myofascial release at incision in the axilla with LUE in abduction/external rotation with hand behind the head.     Manual Lymphatic Drainage (MLD)  short neck, 5 diaphragmatic breaths, right axillary nodes and establishment of interaxillary pathway, left inguinal nodes and establishment of axillo inguinal pathway, left breast moving fluid towards pathways while instructing pt in correct technique then retracing all steps                  PT Long Term  Goals - 03/05/19 1247      PT LONG TERM GOAL #1   Title  Pt will report a 50% reduction in left breast swelling to decrease risk of infection.    Time  4    Period  Weeks    Status  New    Target Date  04/02/19      PT LONG TERM GOAL #2   Title  Pt will report a 75% improvement in fibrosis around left axillary scar to allow improved comfort.    Time  4    Period  Weeks    Status  New    Target Date  04/02/19      PT LONG TERM GOAL #3   Title  Pt will demonstrate 170 degrees of left shoulder abduction to allow her to reach items and return to PLOF.    Baseline  162    Time  4    Period  Weeks    Status  New    Target Date  04/02/19      PT LONG TERM GOAL #4   Title  Pt will receive appropriate compression garments for long term management of edema.    Time  4    Period  Weeks    Status  New    Target Date  04/02/19            Plan -  03/19/19 0955    Clinical Impression Statement  Continued with MLD to left breast in area of hematoma. Pt's hematoma seems to be getting smaller and her lumpectomy scar is less tender since beginning the soft tissue mobilization to the area. She has been wearing the foam in her sports bra which is helping though she fell asleep without her bra last night.    PT Frequency  2x / week    PT Duration  4 weeks    PT Treatment/Interventions  ADLs/Self Care Home Management;Therapeutic exercise;Therapeutic activities;Patient/family education;Manual techniques;Manual lymph drainage;Compression bandaging;Scar mobilization;Passive range of motion;Taping;Vasopneumatic Device    PT Next Visit Plan  assess self MLD of the L breast, instruct pt in Strength ABC program - she is eager to return to exercise and instruct in progressing resistance    PT Home Exercise Plan  self MLD for the L breast    Consulted and Agree with Plan of Care  Patient       Patient will benefit from skilled therapeutic intervention in order to improve the following deficits and impairments:     Visit Diagnosis: Localized edema     Problem List Patient Active Problem List   Diagnosis Date Noted  . Genetic testing 01/27/2019  . Family history of breast cancer   . Family history of prostate cancer   . Family history of colon cancer   . Malignant neoplasm of upper-outer quadrant of left breast in female, estrogen receptor positive ([place]) 01/13/2019  . Trigger finger of left thumb 03/03/2018  . Trigger middle finger of left hand 03/03/2018    Allyson Sabal [facility] 03/19/2019, 9:57 AM  [place], [place], [address] Phone: [phone]   Fax:  [phone]  Name: Judith Dixon MRN: [id number] Date of Birth: 1960-05-24   Manus Gunning, PT 03/19/19 9:57 AM

## 2019-03-22 ENCOUNTER — Telehealth: Payer: Self-pay | Admitting: Hematology and Oncology

## 2019-03-22 NOTE — Telephone Encounter (Signed)
Scheduled per 2/11 sch msg. Called and spoke with pt, confirmed 3/26 appt

## 2019-03-23 ENCOUNTER — Ambulatory Visit: Payer: BC Managed Care – PPO | Admitting: Physical Therapy

## 2019-03-23 ENCOUNTER — Encounter: Payer: Self-pay | Admitting: Physical Therapy

## 2019-03-23 ENCOUNTER — Other Ambulatory Visit: Payer: Self-pay

## 2019-03-23 DIAGNOSIS — R600 Localized edema: Secondary | ICD-10-CM | POA: Diagnosis not present

## 2019-03-23 NOTE — Therapy (Signed)
[place], [place], [address] Phone: [phone]   Fax:  [phone]  Physical Therapy Treatment  Patient Details  Name: Judith Dixon MRN: [id number] Date of Birth: 10-Dec-1960 Referring Provider (PT): Ave Filter Date: 03/23/2019  PT End of Session - 03/23/19 0949    Visit Number  6    Number of Visits  9    Date for PT Re-Evaluation  04/02/19    PT Start Time  0905    PT Stop Time  0949    PT Time Calculation (min)  44 min    Activity Tolerance  Patient tolerated treatment well    Behavior During Therapy  [facility] for tasks assessed/performed       Past Medical History:  Diagnosis Date  . Allergy   . Breast cancer [facility])    left breast cancer diagnosed 01-05-2019- surgery 02-10-2019  . Complication of anesthesia    hard time waking up  . Family history of breast cancer   . Family history of colon cancer   . Family history of prostate cancer   . Motion sickness     Past Surgical History:  Procedure Laterality Date  . BREAST LUMPECTOMY WITH RADIOACTIVE SEED AND SENTINEL LYMPH NODE BIOPSY Left 02/10/2019   Procedure: LEFT BREAST LUMPECTOMY WITH RADIOACTIVE SEED AND LEFT AXILLARY SENTINEL LYMPH NODE BIOPSY;  Surgeon: Rolm Bookbinder, MD;  Location: [place];  Service: General;  Laterality: Left;  . ENDOMETRIAL ABLATION    . WISDOM TOOTH EXTRACTION     age [age]  . WRIST SURGERY      There were no vitals filed for this visit.  Subjective Assessment - 03/23/19 0907    Subjective  The hematoma is getting smaller. I am having to take benadryl every night to sleep due to stress.    Pertinent History  L brast cancer s/p L lumpectomy and SLNB (4 nodes all negative) on 02/10/19, pt will find out if she needs radiation on 03/15/19, does not need chemo    Patient Stated Goals  to get the swelling down    Currently in Pain?  No/denies    Pain Score  0-No pain                   Outpatient Rehab from 03/05/2019 in [place]  Lymphedema Life Impact Scale Total Score  35.29 %           [facility] Adult PT Treatment/Exercise - 03/23/19 0001      Manual Therapy   Soft tissue mobilization  along axillary scar and to area of hematoma, along lumpectomy scar in area of fibrosis - pt has less tenderness today, area was smaller today    Myofascial Release  light longitudinal myofascial release at incision in the axilla with LUE in abduction/external rotation with hand behind the head.     Manual Lymphatic Drainage (MLD)  short neck, 5 diaphragmatic breaths, right axillary nodes and establishment of interaxillary pathway, left inguinal nodes and establishment of axillo inguinal pathway, left breast moving fluid towards pathways while instructing pt in correct technique then retracing all steps                  PT Long Term Goals - 03/23/19 0908      PT LONG TERM GOAL #1   Title  Pt will report a 50% reduction in left breast swelling to decrease risk of infection.    Baseline  03/23/19- 75% reduction in hematoma    Time  4    Period  Weeks    Status  Achieved      PT LONG TERM GOAL #2   Title  Pt will report a 75% improvement in fibrosis around left axillary scar to allow improved comfort.    Baseline  03/23/19- 75% improvement    Time  4    Period  Weeks    Status  Achieved      PT LONG TERM GOAL #3   Title  Pt will demonstrate 170 degrees of left shoulder abduction to allow her to reach items and return to PLOF.    Baseline  162, 03/23/19- 170 degrees    Time  4    Period  Weeks    Status  Achieved      PT LONG TERM GOAL #4   Title  Pt will receive appropriate compression garments for long term management of edema.    Baseline  03/23/19- pt still wearing foam in her sports bra, will get a compression bra if pt continues to have swelling that is not going down    Time  4    Period  Weeks     Status  On-going      PT LONG TERM GOAL #5   Title  Pt will demonstrate 90%  decrease in fibrosis in area of hematoma to avoid needing the area drained.    Time  4    Period  Weeks    Status  New    Target Date  04/20/19            Plan - 03/23/19 0950    Clinical Impression Statement  Assessed pt's progress towards goals in therapy. She has met almost all goals. Added new goal for fibrosis in area of hematoma in left breast. Once hematoma is managed will instruct pt in Strength ABC program.    PT Frequency  2x / week    PT Duration  4 weeks    PT Treatment/Interventions  ADLs/Self Care Home Management;Therapeutic exercise;Therapeutic activities;Patient/family education;Manual techniques;Manual lymph drainage;Compression bandaging;Scar mobilization;Passive range of motion;Taping;Vasopneumatic Device    PT Next Visit Plan  assess hematoma and continue to work on this, instruct pt in Strength ABC program - she is eager to return to exercise and instruct in progressing resistance    PT Home Exercise Plan  self MLD/STM for the L breast    Consulted and Agree with Plan of Care  Patient       Patient will benefit from skilled therapeutic intervention in order to improve the following deficits and impairments:  Pain, Increased fascial restricitons, Decreased strength, Decreased knowledge of use of DME, Decreased range of motion, Decreased scar mobility, Increased edema  Visit Diagnosis: Localized edema     Problem List Patient Active Problem List   Diagnosis Date Noted  . Genetic testing 01/27/2019  . Family history of breast cancer   . Family history of prostate cancer   . Family history of colon cancer   . Malignant neoplasm of upper-outer quadrant of left breast in female, estrogen receptor positive ([place]) 01/13/2019  . Trigger finger of left thumb 03/03/2018  . Trigger middle finger of left hand 03/03/2018    Allyson Sabal [facility] 03/23/2019, 9:56 AM  [place], [place], [address] Phone: [phone]   Fax:  [phone]  Name: Georgine Wiltse MRN: [id number] Date of Birth: Aug 07, 1960  Allyson Sabal  Blue, PT 03/23/19 9:57 AM

## 2019-03-26 ENCOUNTER — Encounter: Payer: BC Managed Care – PPO | Admitting: Physical Therapy

## 2019-03-28 NOTE — Progress Notes (Signed)
Radiation Oncology         [phone] ________________________________  Name: Gimena Buick MRN: [id number]  Date: 03/29/2019  DOB: Dec 30, 1960  SIMULATION AND TREATMENT PLANNING NOTE    ICD-10-CM   1. Malignant neoplasm of upper-outer quadrant of left breast in female, estrogen receptor positive ([place])  C50.412    Z17.0     DIAGNOSIS: Stage IA (T1b, N0) Left Breast UOQ, Invasive Ductal Carcinoma with DCIS, ER+ / PR+ / Her2-, Grade 2  NARRATIVE:  The patient was brought to the [place].  Identity was confirmed.  All relevant records and images related to the planned course of therapy were reviewed.  The patient freely provided informed written consent to proceed with treatment after reviewing the details related to the planned course of therapy. The consent form was witnessed and verified by the simulation staff.  Then, the patient was set-up in a stable reproducible supine position for radiation therapy.  CT images were obtained.  Surface markings were placed.  The CT images were loaded into the planning software.  Then the target and avoidance structures were contoured.  Treatment planning then occurred.  The radiation prescription was entered and confirmed.  Then, I designed and supervised the construction of a total of 5 medically necessary complex treatment devices.  I have requested : 3D Simulation  I have requested a DVH of the following structures: Heart, lungs, lumpectomy cavity.  I have ordered:CBC  PLAN:  The patient will receive 40.05 Gy in 15 fractions directed to the left breast.  The patient will then proceed with a boost to the lumpectomy cavity of 12 Gy in 6 fractions.   Optical Surface Tracking Plan:  Since intensity modulated radiotherapy (IMRT) and 3D conformal radiation treatment methods are predicated on accurate and precise positioning for treatment, intrafraction motion monitoring is medically necessary to ensure accurate and safe treatment  delivery.  The ability to quantify intrafraction motion without excessive ionizing radiation dose can only be performed with optical surface tracking. Accordingly, surface imaging offers the opportunity to obtain 3D measurements of patient position throughout IMRT and 3D treatments without excessive radiation exposure.  I am ordering optical surface tracking for this patient's upcoming course of radiotherapy. ________________________________  Special treatment procedure:   was performed today due to the extra time and effort required by myself to plan and prepare this patient for deep inspiration breath hold technique.  I have determined cardiac sparing to be of benefit to this patient to prevent long term cardiac damage due to radiation of the heart.  Bellows were placed on the patient's abdomen. To facilitate cardiac sparing, the patient was coached by the radiation therapists on breath hold techniques and breathing practice was performed. Practice waveforms were obtained. The patient was then scanned while maintaining breath hold in the treatment position.  This image was then transferred over to the imaging specialist. The imaging specialist then created a fusion of the free breathing and breath hold scans using the chest wall as the stable structure. I personally reviewed the fusion in axial, coronal and sagittal image planes.  Excellent cardiac sparing was obtained.  I felt the patient is an appropriate candidate for breath hold and the patient will be treated as such.  The image fusion was then reviewed with the patient to reinforce the necessity of reproducible breath hold.   -----------------------------------  Blair Promise, PhD, MD  This document serves as a record of services personally performed by Gery Pray, MD. It was  created on his behalf by Clerance Lav, a trained medical scribe. The creation of this record is based on the scribe's personal observations and the provider's statements  to them. This document has been checked and approved by the attending provider.

## 2019-03-29 ENCOUNTER — Other Ambulatory Visit: Payer: Self-pay

## 2019-03-29 ENCOUNTER — Ambulatory Visit
Admission: RE | Admit: 2019-03-29 | Discharge: 2019-03-29 | Disposition: A | Discharge status: Home / Self Care | Payer: BC Managed Care – PPO | Source: Ambulatory Visit | Attending: Radiation Oncology | Admitting: Radiation Oncology

## 2019-03-29 DIAGNOSIS — Z510 Encounter for antineoplastic radiation therapy: Secondary | ICD-10-CM | POA: Diagnosis not present

## 2019-03-29 DIAGNOSIS — C50412 Malignant neoplasm of upper-outer quadrant of left female breast: Secondary | ICD-10-CM | POA: Diagnosis present

## 2019-03-29 DIAGNOSIS — Z170 Estrogen receptor positive status [ER+]: Secondary | ICD-10-CM | POA: Diagnosis not present

## 2019-03-30 ENCOUNTER — Ambulatory Visit: Payer: BC Managed Care – PPO

## 2019-03-30 DIAGNOSIS — R600 Localized edema: Secondary | ICD-10-CM | POA: Diagnosis not present

## 2019-03-30 DIAGNOSIS — M6281 Muscle weakness (generalized): Secondary | ICD-10-CM

## 2019-03-30 DIAGNOSIS — M25612 Stiffness of left shoulder, not elsewhere classified: Secondary | ICD-10-CM

## 2019-03-30 NOTE — Therapy (Signed)
[place], [place], [address] Phone: [phone]   Fax:  [phone]  Physical Therapy Treatment  Patient Details  Name: Judith Dixon MRN: [id number] Date of Birth: 06/12/1960 Referring Provider (PT): Ave Filter Date: 03/30/2019  PT End of Session - 03/30/19 0905    Visit Number  7    Number of Visits  9    Date for PT Re-Evaluation  04/02/19    PT Start Time  0905    PT Stop Time  1000    PT Time Calculation (min)  55 min    Activity Tolerance  Patient tolerated treatment well    Behavior During Therapy  [facility] for tasks assessed/performed       Past Medical History:  Diagnosis Date  . Allergy   . Breast cancer [facility])    left breast cancer diagnosed 01-05-2019- surgery 02-10-2019  . Complication of anesthesia    hard time waking up  . Family history of breast cancer   . Family history of colon cancer   . Family history of prostate cancer   . Motion sickness     Past Surgical History:  Procedure Laterality Date  . BREAST LUMPECTOMY WITH RADIOACTIVE SEED AND SENTINEL LYMPH NODE BIOPSY Left 02/10/2019   Procedure: LEFT BREAST LUMPECTOMY WITH RADIOACTIVE SEED AND LEFT AXILLARY SENTINEL LYMPH NODE BIOPSY;  Surgeon: Rolm Bookbinder, MD;  Location: [place];  Service: General;  Laterality: Left;  . ENDOMETRIAL ABLATION    . WISDOM TOOTH EXTRACTION     age [age]  . WRIST SURGERY      There were no vitals filed for this visit.  Subjective Assessment - 03/30/19 0906    Subjective  Pt reports that she is feeling better. She is getting some new pains near the nipple that are shooting. She is very concerned about lymphedema and radiation.    Pertinent History  L brast cancer s/p L lumpectomy and SLNB (4 nodes all negative) on 02/10/19, pt will find out if she needs radiation on 03/15/19, does not need chemo    Patient Stated Goals  to get the swelling down    Currently in  Pain?  No/denies    Pain Score  0-No pain                  Outpatient Rehab from 03/05/2019 in [place]  Lymphedema Life Impact Scale Total Score  35.29 %           [facility] Adult PT Treatment/Exercise - 03/30/19 0001      Exercises   Exercises  Other Exercises    Other Exercises   First 12 pages of the strength ABC performing all the stretches with demonstration and return demonstration.       Manual Therapy   Manual Therapy  Myofascial release;Soft tissue mobilization;Manual Lymphatic Drainage (MLD)    Soft tissue mobilization  along axillary scar, at pectoralis major insertion on humerus and scar tissue bands within axilla from lateral R trunk wall to the axillary/trunk junction and to area of hematoma, along lumpectomy scar in area of fibrosis - less tenderness and continues with smaller size    Myofascial Release  light longitudinal myofascial release at incision in the axilla with LUE in abduction/external rotation with hand behind the head.     Manual Lymphatic Drainage (MLD)  short neck, 5 diaphragmatic breaths, right axillary nodes and establishment of interaxillary pathway, left inguinal nodes and establishment  of axillo inguinal pathway, left breast moving fluid towards pathways, re-worked pathways, deep abdominals.              PT Education - 03/30/19 1101    Education Details  First half of strength ABC program. Discussed performing ROM during radiation. She was educated that she should not wear her compression during radiation if her skin is compromised.    Person(s) Educated  Patient    Methods  Explanation;Demonstration    Comprehension  Verbalized understanding;Returned demonstration          PT Long Term Goals - 03/23/19 0908      PT LONG TERM GOAL #1   Title  Pt will report a 50% reduction in left breast swelling to decrease risk of infection.    Baseline  03/23/19- 75% reduction in hematoma    Time  4     Period  Weeks    Status  Achieved      PT LONG TERM GOAL #2   Title  Pt will report a 75% improvement in fibrosis around left axillary scar to allow improved comfort.    Baseline  03/23/19- 75% improvement    Time  4    Period  Weeks    Status  Achieved      PT LONG TERM GOAL #3   Title  Pt will demonstrate 170 degrees of left shoulder abduction to allow her to reach items and return to PLOF.    Baseline  162, 03/23/19- 170 degrees    Time  4    Period  Weeks    Status  Achieved      PT LONG TERM GOAL #4   Title  Pt will receive appropriate compression garments for long term management of edema.    Baseline  03/23/19- pt still wearing foam in her sports bra, will get a compression bra if pt continues to have swelling that is not going down    Time  4    Period  Weeks    Status  On-going      PT LONG TERM GOAL #5   Title  Pt will demonstrate 90%  decrease in fibrosis in area of hematoma to avoid needing the area drained.    Time  4    Period  Weeks    Status  New    Target Date  04/20/19            Plan - 03/30/19 [id number]    Clinical Impression Statement  Pt has one more visit after today. Began strength ABC program today doing all of the stretching. Pt has a lot of concerns about lymphedema related to radiation; discussed risk reduction precautions and risk of lymphedema following radiation. Pt was educated on prophylactic use of sleeve during repetitive activities, activities in hot weather and during flights to decrease change of lymphedema. Discussed the importance of early intervention for lymphedema and how to check including for increased edema, heaviness or pain in the radiated side. STM/myofascial release was performed on the L axillary area and in the L breast over scar tissue/fibrotic areas with minimal improvement by end of session. MLD was performed for the L breast following STM. Pt will benefit from continued POC at this time.    Examination-Activity Limitations   Lift;Reach Overhead;Carry    Examination-Participation Restrictions  Cleaning    PT Frequency  2x / week    PT Duration  4 weeks    PT Treatment/Interventions  ADLs/Self Care Home Management;Therapeutic  exercise;Therapeutic activities;Patient/family education;Manual techniques;Manual lymph drainage;Compression bandaging;Scar mobilization;Passive range of motion;Taping;Vasopneumatic Device    PT Next Visit Plan  assess hematoma and continue to work on this, instruct pt in Strength ABC program from pg 12 and provide with hand out.    PT Home Exercise Plan  self MLD/STM for the L breast    Consulted and Agree with Plan of Care  Patient       Patient will benefit from skilled therapeutic intervention in order to improve the following deficits and impairments:  Pain, Increased fascial restricitons, Decreased strength, Decreased knowledge of use of DME, Decreased range of motion, Decreased scar mobility, Increased edema  Visit Diagnosis: Localized edema  Stiffness of left shoulder, not elsewhere classified  Muscle weakness (generalized)     Problem List Patient Active Problem List   Diagnosis Date Noted  . Genetic testing 01/27/2019  . Family history of breast cancer   . Family history of prostate cancer   . Family history of colon cancer   . Malignant neoplasm of upper-outer quadrant of left breast in female, estrogen receptor positive ([place]) 01/13/2019  . Trigger finger of left thumb 03/03/2018  . Trigger middle finger of left hand 03/03/2018    Ander Purpura, PT 03/30/2019, 11:05 AM  [place], [place], [address] Phone: [phone]   Fax:  [phone]  Name: Gissela Luby MRN: [id number] Date of Birth: 10-Nov-1960

## 2019-04-01 DIAGNOSIS — C50412 Malignant neoplasm of upper-outer quadrant of left female breast: Secondary | ICD-10-CM | POA: Diagnosis not present

## 2019-04-02 ENCOUNTER — Ambulatory Visit: Payer: BC Managed Care – PPO

## 2019-04-02 ENCOUNTER — Other Ambulatory Visit: Payer: Self-pay

## 2019-04-02 DIAGNOSIS — R600 Localized edema: Secondary | ICD-10-CM | POA: Diagnosis not present

## 2019-04-02 DIAGNOSIS — M25612 Stiffness of left shoulder, not elsewhere classified: Secondary | ICD-10-CM

## 2019-04-02 DIAGNOSIS — M6281 Muscle weakness (generalized): Secondary | ICD-10-CM

## 2019-04-02 NOTE — Therapy (Addendum)
[place], [place], [address] Phone: [phone]   Fax:  [phone]  Physical Therapy Discharge Note  Patient Details  Name: Judith Dixon MRN: [id number] Date of Birth: [birth year] Referring Provider (PT): Ave Filter Date: 04/02/2019  PT End of Session - 04/02/19 0907    Visit Number  8    Number of Visits  9    Date for PT Re-Evaluation  04/02/19    PT Start Time  0905    PT Stop Time  1000    PT Time Calculation (min)  55 min    Activity Tolerance  Patient tolerated treatment well    Behavior During Therapy  [facility] for tasks assessed/performed       Past Medical History:  Diagnosis Date  . Allergy   . Breast cancer [facility])    left breast cancer diagnosed 01-05-2019- surgery 02-10-2019  . Complication of anesthesia    hard time waking up  . Family history of breast cancer   . Family history of colon cancer   . Family history of prostate cancer   . Motion sickness     Past Surgical History:  Procedure Laterality Date  . BREAST LUMPECTOMY WITH RADIOACTIVE SEED AND SENTINEL LYMPH NODE BIOPSY Left 02/10/2019   Procedure: LEFT BREAST LUMPECTOMY WITH RADIOACTIVE SEED AND LEFT AXILLARY SENTINEL LYMPH NODE BIOPSY;  Surgeon: Rolm Bookbinder, MD;  Location: [place];  Service: General;  Laterality: Left;  . ENDOMETRIAL ABLATION    . WISDOM TOOTH EXTRACTION     age [age]  . WRIST SURGERY      There were no vitals filed for this visit.  Subjective Assessment - 04/02/19 0907    Subjective  Pt reports that she is still getting pains that are radiating to the nipple. Pt is still very concerned about radiation.    Pertinent History  L brast cancer s/p L lumpectomy and SLNB (4 nodes all negative) on 02/10/19, pt will find out if she needs radiation on 03/15/19, does not need chemo    Patient Stated Goals  to get the swelling down    Currently in Pain?  No/denies    Pain Score   0-No pain                  Outpatient Rehab from 03/05/2019 in [place]  Lymphedema Life Impact Scale Total Score  35.29 %           [facility] Adult PT Treatment/Exercise - 04/02/19 0001      Exercises   Exercises  Other Exercises    Other Exercises   From page 12 bird dog 20x alt w/tactile cues and VC at the LB and abdomen for proper activation of core musculature, Supine pelvic tilt w/marching modification from sit up. glute bridge w/VC for correct movement, Supne chest press 2# bil demonstration/VC for correct movement, Shoulder bent over row and extension 2# LUE demonstration for correct movement , 8 inch step up with SLS at the top 10x Bil w/demonstration, Bicep curls 2# bil 10x demonstration, heel raises w/2# weights 10x w/glute squeeze and demonstration/VC, Scaption to 90 degrees 2# bil w/demonstration, sit to stand 10x w/demonstration and VC for correct knee progression with movement       Manual Therapy   Manual Therapy  Soft tissue mobilization;Manual Lymphatic Drainage (MLD)    Soft tissue mobilization  Around and along hematoma in the L breast from below the nipple up  toward the axilla and along the incision around the lateral aspect of the areola; moderate softening following STM.     Manual Lymphatic Drainage (MLD)  short neck, 5 diaphragmatic breaths, right axillary nodes and establishment of interaxillary pathway, left inguinal nodes and establishment of axillo inguinal pathway, left breast moving fluid towards pathways, re-worked pathways             PT Education - 04/02/19 1009    Education Details  Finished education on strength ABC program, discussed continuing with MLD and wearing compression as long as she is able throughout radiation.    Person(s) Educated  Patient    Methods  Explanation;Demonstration;Tactile cues;Verbal cues;Handout    Comprehension  Verbalized understanding;Returned demonstration           PT Long Term Goals - 04/02/19 1014      PT LONG TERM GOAL #4   Title  Pt will receive appropriate compression garments for long term management of edema.    Baseline  Pt is wearing a sports bra with foam on a daily basis.    Status  Achieved      PT LONG TERM GOAL #5   Title  Pt will demonstrate 90%  decrease in fibrosis in area of hematoma to avoid needing the area drained.    Baseline  Pt has improvement in her hematoma but continues with swelling/hematoma in the L lateral breast.    Status  Partially Met            Plan - 04/02/19 0906    Clinical Impression Statement  [facility] program today doing all of the strength training with 1 modification for sit up to address lower abdominal strength and decrease stress on the neck using pelvic tilt w/marching. Pt continues with concerns about radiation she was advised to write down her questions to ask her MD at her appointment this afternoon. Discussed places where she can purchase a prophylactic sleeve. STM over the L breast hematoma and scar at the L areola with moderate softening following STM. MLD was performed following STM in order to decrease risk for increased edema. Pt has met all but 1 goal she continues with hematoma in the L breast but has significant reduction and improvement in ROM. She will be discharged and this time and return as needed following radiation.    Examination-Activity Limitations  Lift;Reach Overhead;Carry    Examination-Participation Restrictions  Cleaning    Rehab Potential  Good    PT Frequency  --    PT Duration  --    PT Treatment/Interventions  ADLs/Self Care Home Management;Therapeutic exercise;Therapeutic activities;Patient/family education;Manual techniques;Manual lymph drainage;Compression bandaging;Scar mobilization;Passive range of motion;Taping;Vasopneumatic Device    PT Next Visit Plan  Pt will be discharged.    PT Home Exercise Plan  self MLD/STM for the L breast    Consulted and  Agree with Plan of Care  Patient       Patient will benefit from skilled therapeutic intervention in order to improve the following deficits and impairments:  Pain, Increased fascial restricitons, Decreased strength, Decreased knowledge of use of DME, Decreased range of motion, Decreased scar mobility, Increased edema  Visit Diagnosis: Localized edema  Stiffness of left shoulder, not elsewhere classified  Muscle weakness (generalized)     Problem List Patient Active Problem List   Diagnosis Date Noted  . Genetic testing 01/27/2019  . Family history of breast cancer   . Family history of prostate cancer   . Family history of colon  cancer   . Malignant neoplasm of upper-outer quadrant of left breast in female, estrogen receptor positive ([place]) 01/13/2019  . Trigger finger of left thumb 03/03/2018  . Trigger middle finger of left hand 03/03/2018   PHYSICAL THERAPY DISCHARGE SUMMARY  Plan: Patient agrees to discharge.  Patient goals were partially met. Patient is being discharged due to being pleased with the current functional level.  ?????       Ander Purpura, PT 04/02/2019, 10:17 AM  [place], [place], [address] Phone: [phone]   Fax:  [phone]  Name: Judith Dixon MRN: [id number] Date of Birth: 1960-11-17

## 2019-04-04 NOTE — Progress Notes (Signed)
  Radiation Oncology         [phone]) [phone] ________________________________  Name: Judith Dixon MRN: [id number]  Date: 04/05/2019  DOB: 22-Feb-1960  Simulation Verification Note    ICD-10-CM   1. Malignant neoplasm of upper-outer quadrant of left breast in female, estrogen receptor positive ([place])  C50.412    Z17.0     NARRATIVE: The patient was brought to the treatment unit and placed in the planned treatment position. The clinical setup was verified. Then port films were obtained and uploaded to the radiation oncology medical record software.  The treatment beams were carefully compared against the planned radiation fields. The position location and shape of the radiation fields was reviewed. They targeted volume of tissue appears to be appropriately covered by the radiation beams. Organs at risk appear to be excluded as planned.  Based on my personal review, I approved the simulation verification. The patient's treatment will proceed as planned.  -----------------------------------  Blair Promise, PhD, MD  This document serves as a record of services personally performed by Gery Pray, MD. It was created on his behalf by Clerance Lav, a trained medical scribe. The creation of this record is based on the scribe's personal observations and the provider's statements to them. This document has been checked and approved by the attending provider.

## 2019-04-05 ENCOUNTER — Ambulatory Visit
Admission: RE | Admit: 2019-04-05 | Discharge: 2019-04-05 | Disposition: A | Discharge status: Home / Self Care | Payer: BC Managed Care – PPO | Source: Ambulatory Visit | Attending: Radiation Oncology | Admitting: Radiation Oncology

## 2019-04-05 ENCOUNTER — Other Ambulatory Visit: Payer: Self-pay

## 2019-04-05 DIAGNOSIS — Z510 Encounter for antineoplastic radiation therapy: Secondary | ICD-10-CM | POA: Insufficient documentation

## 2019-04-05 DIAGNOSIS — C50412 Malignant neoplasm of upper-outer quadrant of left female breast: Secondary | ICD-10-CM | POA: Diagnosis present

## 2019-04-05 DIAGNOSIS — Z170 Estrogen receptor positive status [ER+]: Secondary | ICD-10-CM | POA: Diagnosis not present

## 2019-04-06 ENCOUNTER — Other Ambulatory Visit: Payer: Self-pay

## 2019-04-06 ENCOUNTER — Ambulatory Visit
Admission: RE | Admit: 2019-04-06 | Discharge: 2019-04-06 | Disposition: A | Discharge status: Home / Self Care | Payer: BC Managed Care – PPO | Source: Ambulatory Visit | Attending: Radiation Oncology | Admitting: Radiation Oncology

## 2019-04-06 DIAGNOSIS — C50412 Malignant neoplasm of upper-outer quadrant of left female breast: Secondary | ICD-10-CM | POA: Diagnosis not present

## 2019-04-07 ENCOUNTER — Other Ambulatory Visit: Payer: Self-pay

## 2019-04-07 ENCOUNTER — Ambulatory Visit
Admission: RE | Admit: 2019-04-07 | Discharge: 2019-04-07 | Disposition: A | Discharge status: Home / Self Care | Payer: BC Managed Care – PPO | Source: Ambulatory Visit | Attending: Radiation Oncology | Admitting: Radiation Oncology

## 2019-04-07 DIAGNOSIS — C50412 Malignant neoplasm of upper-outer quadrant of left female breast: Secondary | ICD-10-CM | POA: Diagnosis not present

## 2019-04-08 ENCOUNTER — Ambulatory Visit
Admission: RE | Admit: 2019-04-08 | Discharge: 2019-04-08 | Disposition: A | Discharge status: Home / Self Care | Payer: BC Managed Care – PPO | Source: Ambulatory Visit | Attending: Radiation Oncology | Admitting: Radiation Oncology

## 2019-04-08 ENCOUNTER — Other Ambulatory Visit: Payer: Self-pay

## 2019-04-08 DIAGNOSIS — C50412 Malignant neoplasm of upper-outer quadrant of left female breast: Secondary | ICD-10-CM | POA: Diagnosis not present

## 2019-04-09 ENCOUNTER — Other Ambulatory Visit: Payer: Self-pay

## 2019-04-09 ENCOUNTER — Ambulatory Visit
Admission: RE | Admit: 2019-04-09 | Discharge: 2019-04-09 | Disposition: A | Discharge status: Home / Self Care | Payer: BC Managed Care – PPO | Source: Ambulatory Visit | Attending: Radiation Oncology | Admitting: Radiation Oncology

## 2019-04-09 DIAGNOSIS — C50412 Malignant neoplasm of upper-outer quadrant of left female breast: Secondary | ICD-10-CM | POA: Diagnosis not present

## 2019-04-12 ENCOUNTER — Ambulatory Visit
Admission: RE | Admit: 2019-04-12 | Discharge: 2019-04-12 | Disposition: A | Discharge status: Home / Self Care | Payer: BC Managed Care – PPO | Source: Ambulatory Visit | Attending: Radiation Oncology | Admitting: Radiation Oncology

## 2019-04-12 ENCOUNTER — Other Ambulatory Visit: Payer: Self-pay

## 2019-04-12 DIAGNOSIS — C50412 Malignant neoplasm of upper-outer quadrant of left female breast: Secondary | ICD-10-CM | POA: Diagnosis not present

## 2019-04-13 ENCOUNTER — Other Ambulatory Visit: Payer: Self-pay

## 2019-04-13 ENCOUNTER — Ambulatory Visit
Admission: RE | Admit: 2019-04-13 | Discharge: 2019-04-13 | Disposition: A | Discharge status: Home / Self Care | Payer: BC Managed Care – PPO | Source: Ambulatory Visit | Attending: Radiation Oncology | Admitting: Radiation Oncology

## 2019-04-13 DIAGNOSIS — C50412 Malignant neoplasm of upper-outer quadrant of left female breast: Secondary | ICD-10-CM | POA: Diagnosis not present

## 2019-04-14 ENCOUNTER — Other Ambulatory Visit: Payer: Self-pay

## 2019-04-14 ENCOUNTER — Ambulatory Visit
Admission: RE | Admit: 2019-04-14 | Discharge: 2019-04-14 | Disposition: A | Discharge status: Home / Self Care | Payer: BC Managed Care – PPO | Source: Ambulatory Visit | Attending: Radiation Oncology | Admitting: Radiation Oncology

## 2019-04-14 DIAGNOSIS — C50412 Malignant neoplasm of upper-outer quadrant of left female breast: Secondary | ICD-10-CM | POA: Diagnosis not present

## 2019-04-15 ENCOUNTER — Other Ambulatory Visit: Payer: Self-pay

## 2019-04-15 ENCOUNTER — Ambulatory Visit
Admission: RE | Admit: 2019-04-15 | Discharge: 2019-04-15 | Disposition: A | Discharge status: Home / Self Care | Payer: BC Managed Care – PPO | Source: Ambulatory Visit | Attending: Radiation Oncology | Admitting: Radiation Oncology

## 2019-04-15 DIAGNOSIS — C50412 Malignant neoplasm of upper-outer quadrant of left female breast: Secondary | ICD-10-CM | POA: Diagnosis not present

## 2019-04-16 ENCOUNTER — Ambulatory Visit
Admission: RE | Admit: 2019-04-16 | Discharge: 2019-04-16 | Disposition: A | Discharge status: Home / Self Care | Payer: BC Managed Care – PPO | Source: Ambulatory Visit | Attending: Radiation Oncology | Admitting: Radiation Oncology

## 2019-04-16 ENCOUNTER — Other Ambulatory Visit: Payer: Self-pay

## 2019-04-16 DIAGNOSIS — C50412 Malignant neoplasm of upper-outer quadrant of left female breast: Secondary | ICD-10-CM | POA: Diagnosis not present

## 2019-04-18 DIAGNOSIS — C50412 Malignant neoplasm of upper-outer quadrant of left female breast: Secondary | ICD-10-CM | POA: Diagnosis not present

## 2019-04-19 ENCOUNTER — Other Ambulatory Visit: Payer: Self-pay

## 2019-04-19 ENCOUNTER — Ambulatory Visit
Admission: RE | Admit: 2019-04-19 | Discharge: 2019-04-19 | Disposition: A | Discharge status: Home / Self Care | Payer: BC Managed Care – PPO | Source: Ambulatory Visit | Attending: Radiation Oncology | Admitting: Radiation Oncology

## 2019-04-19 DIAGNOSIS — C50412 Malignant neoplasm of upper-outer quadrant of left female breast: Secondary | ICD-10-CM | POA: Diagnosis not present

## 2019-04-20 ENCOUNTER — Ambulatory Visit: Payer: BC Managed Care – PPO | Admitting: Radiation Oncology

## 2019-04-20 ENCOUNTER — Ambulatory Visit
Admission: RE | Admit: 2019-04-20 | Discharge: 2019-04-20 | Disposition: A | Discharge status: Home / Self Care | Payer: BC Managed Care – PPO | Source: Ambulatory Visit | Attending: Radiation Oncology | Admitting: Radiation Oncology

## 2019-04-20 ENCOUNTER — Other Ambulatory Visit: Payer: Self-pay

## 2019-04-20 DIAGNOSIS — C50412 Malignant neoplasm of upper-outer quadrant of left female breast: Secondary | ICD-10-CM | POA: Diagnosis not present

## 2019-04-21 ENCOUNTER — Other Ambulatory Visit: Payer: Self-pay

## 2019-04-21 ENCOUNTER — Ambulatory Visit
Admission: RE | Admit: 2019-04-21 | Discharge: 2019-04-21 | Disposition: A | Discharge status: Home / Self Care | Payer: BC Managed Care – PPO | Source: Ambulatory Visit | Attending: Radiation Oncology | Admitting: Radiation Oncology

## 2019-04-21 DIAGNOSIS — C50412 Malignant neoplasm of upper-outer quadrant of left female breast: Secondary | ICD-10-CM | POA: Diagnosis not present

## 2019-04-22 ENCOUNTER — Ambulatory Visit
Admission: RE | Admit: 2019-04-22 | Discharge: 2019-04-22 | Disposition: A | Discharge status: Home / Self Care | Payer: BC Managed Care – PPO | Source: Ambulatory Visit | Attending: Radiation Oncology | Admitting: Radiation Oncology

## 2019-04-22 ENCOUNTER — Other Ambulatory Visit: Payer: Self-pay

## 2019-04-22 DIAGNOSIS — C50412 Malignant neoplasm of upper-outer quadrant of left female breast: Secondary | ICD-10-CM | POA: Diagnosis not present

## 2019-04-23 ENCOUNTER — Ambulatory Visit
Admission: RE | Admit: 2019-04-23 | Discharge: 2019-04-23 | Disposition: A | Discharge status: Home / Self Care | Payer: BC Managed Care – PPO | Source: Ambulatory Visit | Attending: Radiation Oncology | Admitting: Radiation Oncology

## 2019-04-23 ENCOUNTER — Other Ambulatory Visit: Payer: Self-pay

## 2019-04-23 DIAGNOSIS — C50412 Malignant neoplasm of upper-outer quadrant of left female breast: Secondary | ICD-10-CM | POA: Diagnosis not present

## 2019-04-26 ENCOUNTER — Other Ambulatory Visit: Payer: Self-pay

## 2019-04-26 ENCOUNTER — Ambulatory Visit
Admission: RE | Admit: 2019-04-26 | Discharge: 2019-04-26 | Disposition: A | Discharge status: Home / Self Care | Payer: BC Managed Care – PPO | Source: Ambulatory Visit | Attending: Radiation Oncology | Admitting: Radiation Oncology

## 2019-04-26 DIAGNOSIS — C50412 Malignant neoplasm of upper-outer quadrant of left female breast: Secondary | ICD-10-CM | POA: Diagnosis not present

## 2019-04-27 ENCOUNTER — Other Ambulatory Visit: Payer: Self-pay

## 2019-04-27 ENCOUNTER — Ambulatory Visit
Admission: RE | Admit: 2019-04-27 | Discharge: 2019-04-27 | Disposition: A | Discharge status: Home / Self Care | Payer: BC Managed Care – PPO | Source: Ambulatory Visit | Attending: Radiation Oncology | Admitting: Radiation Oncology

## 2019-04-27 DIAGNOSIS — C50412 Malignant neoplasm of upper-outer quadrant of left female breast: Secondary | ICD-10-CM | POA: Diagnosis not present

## 2019-04-28 ENCOUNTER — Other Ambulatory Visit: Payer: Self-pay

## 2019-04-28 ENCOUNTER — Ambulatory Visit
Admission: RE | Admit: 2019-04-28 | Discharge: 2019-04-28 | Disposition: A | Discharge status: Home / Self Care | Payer: BC Managed Care – PPO | Source: Ambulatory Visit | Attending: Radiation Oncology | Admitting: Radiation Oncology

## 2019-04-28 DIAGNOSIS — C50412 Malignant neoplasm of upper-outer quadrant of left female breast: Secondary | ICD-10-CM | POA: Diagnosis not present

## 2019-04-29 ENCOUNTER — Ambulatory Visit
Admission: RE | Admit: 2019-04-29 | Discharge: 2019-04-29 | Disposition: A | Discharge status: Home / Self Care | Payer: BC Managed Care – PPO | Source: Ambulatory Visit | Attending: Radiation Oncology | Admitting: Radiation Oncology

## 2019-04-29 ENCOUNTER — Other Ambulatory Visit: Payer: Self-pay

## 2019-04-29 DIAGNOSIS — C50412 Malignant neoplasm of upper-outer quadrant of left female breast: Secondary | ICD-10-CM | POA: Diagnosis not present

## 2019-04-29 NOTE — Progress Notes (Signed)
Patient Care Team: [name], PA as PCP - General (Family Medicine) Mauro Kaufmann, RN as Oncology Nurse Navigator Rockwell Germany, RN as Oncology Nurse Navigator  DIAGNOSIS:    ICD-10-CM   1. Malignant neoplasm of upper-outer quadrant of left breast in female, estrogen receptor positive ([place])  C50.412    Z17.0     SUMMARY OF ONCOLOGIC HISTORY: Oncology History  Malignant neoplasm of upper-outer quadrant of left breast in female, estrogen receptor positive ([place])  01/05/2019 Cancer Staging   Staging form: Breast, AJCC 8th Edition - Clinical stage from 01/05/2019: Stage IA (cT1b, cN0, cM0, G2, ER+, PR+, HER2-) - Signed by [name], NP on 01/13/2019   01/05/2019 Initial Diagnosis   Routine screening mammogram detected a 0.6cm architectural distortion in the left breast. Biopsy showed IDC with DCIS, grade 2, HER-2 - by FISH, ER+ 90%, PR+ 85%, Ki67 15%.    Genetic Testing   Negative genetic testing. No pathogenic variants identified. VUS in CHEK2 called c.8G>A identified on the [facility] Breast Cancer STAT Panel + Common Hereditary Cancers Panel. The STAT Breast cancer panel offered by [facility] includes sequencing and rearrangement analysis for the following 9 genes:  ATM, BRCA1, BRCA2, CDH1, CHEK2, PALB2, PTEN, STK11 and TP53.  The Common Hereditary Cancers Panel offered by [facility] includes sequencing and/or deletion duplication testing of the following 48 genes: APC, ATM, AXIN2, BARD1, BMPR1A, BRCA1, BRCA2, BRIP1, CDH1, CDKN2A (p14ARF), CDKN2A (p16INK4a), CKD4, CHEK2, CTNNA1, DICER1, EPCAM (Deletion/duplication testing only), GREM1 (promoter region deletion/duplication testing only), KIT, MEN1, MLH1, MSH2, MSH3, MSH6, MUTYH, NBN, NF1, NHTL1, PALB2, PDGFRA, PMS2, POLD1, POLE, PTEN, RAD50, RAD51C, RAD51D, RNF43, SDHB, SDHC, SDHD, SMAD4, SMARCA4. STK11, TP53, TSC1, TSC2, and VHL.  The following genes were evaluated for sequence changes only: SDHA and HOXB13 c.251G>A variant only.  The report date is 01/26/2019.    02/10/2019 Cancer Staging   Staging form: Breast, AJCC 8th Edition - Pathologic stage from 02/10/2019: Stage IA (pT1b, pN0, cM0, G2, ER+, PR+, HER2-) - Signed by [name], NP on 02/24/2019   02/10/2019 Surgery   Left lumpectomy [name]): IDC, grade 2, 0.6cm, with intermediate grade DCIS, clear margins, 4 left axillary lymph nodes negative    03/02/2019 Oncotype testing   Oncotype: 13/4%   04/06/2019 -  Radiation Therapy   Adjuvant radiation therapy     CHIEF COMPLIANT: Follow-up to discuss antiestrogen therapy  INTERVAL HISTORY: Judith Dixon is a 59 y.o. with above-mentioned history of left breast cancer who underwent a lumpectomy and is currently on radiation therapy. She presents to the clinic today to discuss antiestrogen therapy.   Patient has profound radiation dermatitis and has 1 more day of boost remaining.  She is here accompanied by her husband and has many concerns regarding antiestrogen therapy.  She is very worried about the risk of hypertension and Alzheimer's.  ALLERGIES:  has no active allergies.  MEDICATIONS:  Current Outpatient Medications  Medication Sig Dispense Refill  . Biotin 1 MG CAPS Take by mouth.    . EPINEPHrine 0.3 mg/0.3 mL IJ SOAJ injection USE AS DIRECTED FOR SEVERE ALLERGIC REACTIONS    . Multiple Vitamin (MULTIVITAMIN) tablet Take 1 tablet by mouth daily.    Marland Kitchen OVER THE COUNTER MEDICATION Natural vitamin for hair growth     No current facility-administered medications for this visit.    PHYSICAL EXAMINATION: ECOG PERFORMANCE STATUS: 1 - Symptomatic but completely ambulatory  There were no vitals filed for this visit. There were no vitals filed  for this visit.  LABORATORY DATA:  I have reviewed the data as listed No flowsheet data found.  No results found for: WBC, HGB, HCT, MCV, PLT, NEUTROABS  ASSESSMENT & PLAN:  Malignant neoplasm of upper-outer quadrant of left breast in female, estrogen  receptor positive ([place]) 02/10/2019:Left lumpectomy [name]): IDC, grade 2, 0.6cm, with intermediate grade DCIS, clear margins, 4 left axillary lymph nodes negative T1BN0 stage Ia Oncotype DX score 13: Low risk: 4%Risk of distant recurrence  Oncotype discussion: I discussed a low risk Oncotype score and that there is no benefit to systemic chemotherapy.  Treatment plan: 1.  Adjuvant radiation 2.  Follow-up adjuvant antiestrogen therapy with anastrozole 1 mg daily x5 years --------------------------------------------------------------------------------------- Patient is very reluctant to consider antiestrogen therapy because of the side effects.  She is very worried about high cholesterol Alzheimer's etc. After much discussion she agreed to take half a tablet of anastrozole daily. We will check her calcium, vitamin D and estradiol levels today. Patient agreed to participate in antiestrogen therapy adherence study.  Return to clinic in 2 months for survivorship care plan visit.   No orders of the defined types were placed in this encounter.  The patient has a good understanding of the overall plan. she agrees with it. she will call with any problems that may develop before the next visit here.  Total time spent: 30 mins including face to face time and time spent for planning, charting and coordination of care  [name], MD 04/30/2019  I, Cloyde Reams Dorshimer, am acting as scribe for Dr. [name].  I have reviewed the above documentation for accuracy and completeness, and I agree with the above.

## 2019-04-30 ENCOUNTER — Inpatient Hospital Stay: Payer: BC Managed Care – PPO | Attending: Hematology and Oncology | Admitting: Hematology and Oncology

## 2019-04-30 ENCOUNTER — Ambulatory Visit
Admission: RE | Admit: 2019-04-30 | Discharge: 2019-04-30 | Disposition: A | Discharge status: Home / Self Care | Payer: BC Managed Care – PPO | Source: Ambulatory Visit | Attending: Radiation Oncology | Admitting: Radiation Oncology

## 2019-04-30 ENCOUNTER — Other Ambulatory Visit: Payer: Self-pay

## 2019-04-30 ENCOUNTER — Inpatient Hospital Stay: Payer: BC Managed Care – PPO

## 2019-04-30 VITALS — BP 144/84 | HR 82 | Temp 98.0°F | Resp 18 | Ht 64.0 in | Wt 144.0 lb

## 2019-04-30 DIAGNOSIS — E559 Vitamin D deficiency, unspecified: Secondary | ICD-10-CM

## 2019-04-30 DIAGNOSIS — Z170 Estrogen receptor positive status [ER+]: Secondary | ICD-10-CM | POA: Diagnosis not present

## 2019-04-30 DIAGNOSIS — C50412 Malignant neoplasm of upper-outer quadrant of left female breast: Secondary | ICD-10-CM

## 2019-04-30 LAB — CMP (CANCER CENTER ONLY)
ALT: 18 U/L (ref 0–44)
AST: 18 U/L (ref 15–41)
Albumin: 3.9 g/dL (ref 3.5–5.0)
Alkaline Phosphatase: 78 U/L (ref 38–126)
Anion gap: 9 (ref 5–15)
BUN: 16 mg/dL (ref 6–20)
CO2: 29 mmol/L (ref 22–32)
Calcium: 9.4 mg/dL (ref 8.9–10.3)
Chloride: 104 mmol/L (ref 98–111)
Creatinine: 0.85 mg/dL (ref 0.44–1.00)
GFR, Est AFR Am: >60 mL/min (ref >60)
GFR, Estimated: >60 mL/min (ref >60)
Glucose, Bld: 92 mg/dL (ref 70–99)
Potassium: 4 mmol/L (ref 3.5–5.1)
Sodium: 142 mmol/L (ref 135–145)
Total Bilirubin: 0.8 mg/dL (ref 0.3–1.2)
Total Protein: 7.2 g/dL (ref 6.5–8.1)

## 2019-04-30 LAB — VITAMIN D 25 HYDROXY (VIT D DEFICIENCY, FRACTURES): Vit D, 25-Hydroxy: 28.45 ng/mL — ABNORMAL LOW (ref 30–100)

## 2019-04-30 MED ORDER — ANASTROZOLE 1 MG PO TABS: 0.5000 mg | ORAL_TABLET | Freq: Every day | ORAL | 3 refills | Status: DC

## 2019-04-30 NOTE — Assessment & Plan Note (Signed)
02/10/2019:Left lumpectomy [name]): IDC, grade 2, 0.6cm, with intermediate grade DCIS, clear margins, 4 left axillary lymph nodes negative T1BN0 stage Ia Oncotype DX score 13: Low risk: 4%Risk of distant recurrence  Oncotype discussion: I discussed a low risk Oncotype score and that there is no benefit to systemic chemotherapy.  Treatment plan: 1.  Adjuvant radiation 2.  Follow-up adjuvant antiestrogen therapy with anastrozole 1 mg daily x5 years --------------------------------------------------------------------------------------- Patient is very reluctant to consider antiestrogen therapy because of the side effects.  She is very worried about high cholesterol Alzheimer's etc.

## 2019-05-01 ENCOUNTER — Encounter: Payer: Self-pay | Admitting: Adult Health

## 2019-05-03 ENCOUNTER — Other Ambulatory Visit: Payer: Self-pay

## 2019-05-03 ENCOUNTER — Encounter: Payer: Self-pay | Admitting: *Deleted

## 2019-05-03 ENCOUNTER — Ambulatory Visit
Admission: RE | Admit: 2019-05-03 | Discharge: 2019-05-03 | Disposition: A | Discharge status: Home / Self Care | Payer: BC Managed Care – PPO | Source: Ambulatory Visit | Attending: Radiation Oncology | Admitting: Radiation Oncology

## 2019-05-03 ENCOUNTER — Encounter: Payer: Self-pay | Admitting: Radiation Oncology

## 2019-05-03 DIAGNOSIS — C50412 Malignant neoplasm of upper-outer quadrant of left female breast: Secondary | ICD-10-CM | POA: Diagnosis not present

## 2019-05-04 ENCOUNTER — Telehealth: Payer: Self-pay | Admitting: Adult Health

## 2019-05-04 ENCOUNTER — Telehealth: Payer: Self-pay | Admitting: *Deleted

## 2019-05-04 NOTE — Telephone Encounter (Signed)
Per Wilber Bihari NP, called to make pt aware to take vitamin D 2000 IU daily in addition to her multivitamin. Labs will be rechecked in 3 months. Pt verbalized understanding and scheduling message sent.

## 2019-05-04 NOTE — Telephone Encounter (Signed)
Scheduled appt per 3/30 sch message - unable reach pt . Left message with appt date and time

## 2019-05-04 NOTE — Telephone Encounter (Signed)
Scheduled per 03/26 los, patient has been called and voicemail was left.

## 2019-05-17 ENCOUNTER — Other Ambulatory Visit: Payer: Self-pay

## 2019-05-17 DIAGNOSIS — Z170 Estrogen receptor positive status [ER+]: Secondary | ICD-10-CM

## 2019-05-17 DIAGNOSIS — C50412 Malignant neoplasm of upper-outer quadrant of left female breast: Secondary | ICD-10-CM

## 2019-06-01 NOTE — Progress Notes (Addendum)
Ms. Judith Dixon presents today for 1 month follow up after completing radiation to the LEFT breast on 05/03/2019.   Skin: reports healing well. Keeping moisturized with a aloe/vitamin E based lotion Fatigue: More tired since completing radiation, but finds time throughout the day to rest Pain: tenderness from residual hemotoma. Has appointment later today with PT to help "break up" Lymphedema: None ROM: no issues Only new issue is that patient has started feeling dizzy since starting her Arimidex prescription.  Vitals:   06/03/19 0926  BP: 126/79  Pulse: 78  Resp: 20  Temp: 98 F (36.7 C)  SpO2: 98%  Weight: 142 lb (64.4 kg)  Height: 5\' 4"  (1.626 m)   Wt Readings from Last 3 Encounters:  06/03/19 142 lb (64.4 kg)  04/30/19 144 lb (65.3 kg)  03/15/19 145 lb (65.8 kg)

## 2019-06-02 NOTE — Progress Notes (Incomplete)
  Patient Name: Judith Dixon MRN: [id number] DOB: Nov 30, 1960 Referring Physician: [name] MATTHEW (Profile Not Attached) Date of Service: 05/03/2019 [facility], [place]                                                        End Of Treatment Note  Diagnoses: C50.412-Malignant neoplasm of upper-outer quadrant of left female breast  Cancer Staging: Stage IA (T1b, N0) Left Breast UOQ, Invasive Ductal Carcinoma with DCIS, ER+ / PR+ / Her2-, Grade 2  Intent: Curative  Radiation Treatment Dates: 04/05/2019 through 05/03/2019 Site Technique Total Dose (Gy) Dose per Fx (Gy) Completed Fx Beam Energies  Breast, Left: Breast_Lt_Bst 3D 12/12 2 6/6 6X, 10X  Breast, Left: Breast_Lt 3D 40.05/40.05 2.67 15/15 6X   Narrative: The patient tolerated radiation therapy relatively well. She did report some mild fatigue, insomnia from recurrent nightmare that was somewhat relieved by Melatonin, and left breast/nipple pain throughout treatment. Towards the end, she began to report some nausea, dizziness, and headache.  At the beginning of treatment, the patient continued to have some swelling in the left breast that was consistent with hematoma/seroma but there were no signs of infection. This improved as treatment continued. However, the patient did develop some mild hyperpigmentation changes/radiation dermatitis in the upper inner aspect of the left breast without skin breakdown.  Plan: The patient will follow-up with radiation oncology in one month.  ________________________________________________   Blair Promise, PhD, MD  This document serves as a record of services personally performed by Gery Pray, MD. It was created on his behalf by Clerance Lav, a trained medical scribe. The creation of this record is based on the scribe's  personal observations and the provider's statements to them. This document has been checked and approved by the attending provider.

## 2019-06-02 NOTE — Progress Notes (Signed)
Radiation Oncology         [phone] ________________________________  Name: Judith Dixon MRN: [id number]  Date: 06/03/2019  DOB: Nov 02, 1960  Follow-Up Visit Note  CC: [name], [place]  Rolm Bookbinder, MD    ICD-10-CM   1. Malignant neoplasm of upper-outer quadrant of left breast in female, estrogen receptor positive ([place])  C50.412    Z17.0     Diagnosis: StageIA(T1b, N0)LeftBreast UOQ,Invasive DuctalCarcinomawith DCIS, ER+/ PR+/ Her2-, Grade2  Interval Since Last Radiation: One month.  Radiation Treatment Dates: 04/05/2019 through 05/03/2019 Site Technique Total Dose (Gy) Dose per Fx (Gy) Completed Fx Beam Energies  Breast, Left: Breast_Lt_Bst 3D 12/12 2 6/6 6X, 10X  Breast, Left: Breast_Lt 3D 40.05/40.05 2.67 15/15 6X    Narrative:  The patient returns today for routine follow-up. No significant interval history since the end of treatment.  On review of systems, she reports fatigue, dizziness since starting Arimidex, and tenderness from residual hematoma. She denies lymphedema.  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  Meds: Current Outpatient Medications  Medication Sig Dispense Refill  . anastrozole (ARIMIDEX) 1 MG tablet Take 0.5 tablets (0.5 mg total) by mouth daily. 30 tablet 3  . Biotin 1 MG CAPS Take by mouth.    . Cholecalciferol (VITAMIN D3) 50 MCG (2000 UT) capsule Take 2,000 Units by mouth daily.    Marland Kitchen EPINEPHrine 0.3 mg/0.3 mL IJ SOAJ injection USE AS DIRECTED FOR SEVERE ALLERGIC REACTIONS    . Multiple Vitamin (MULTIVITAMIN) tablet Take 1 tablet by mouth daily.    Marland Kitchen OVER THE COUNTER MEDICATION Natural vitamin for hair growth     No current facility-administered medications for this encounter.    Physical Findings: The patient is in no acute distress. Patient is alert and oriented.  height is '5\' 4"'$  (1.626 m) and weight is 142 lb (64.4 kg). Her temperature is  98 F (36.7 C). Her blood pressure is 126/79 and her pulse is 78. Her respiration is 20 and oxygen saturation is 98%.   No significant changes. Lungs are clear to auscultation bilaterally. Heart has regular rate and rhythm. No palpable cervical, supraclavicular, or axillary adenopathy. Abdomen soft, non-tender, normal bowel sounds. Right breast: No palpable mass, nipple discharge, or bleeding. Left breast: Skin is well-healed.  Mild hyperpigmentation changes.  Patient continues to have some induration in the region of the lumpectomy site but overall seems to be less significant.  No nipple discharge or bleeding  Lab Findings: No results found for: WBC, HGB, HCT, MCV, PLT  Radiographic Findings: No results found.  Impression: StageIA(T1b, N0)LeftBreast UOQ,Invasive DuctalCarcinomawith DCIS, ER+/ PR+/ Her2-, Grade2  The patient is recovering from the effects of radiation.  Skin is well-healed at this time  Plan: The patient is scheduled to see Wilber Bihari, NP, on 06/30/2019. Follow-up with radiation oncology as needed.  Patient will be meeting with physical therapy for techniques concerning breast massage to help with her induration at the lumpectomy site.  ____________________________________   Blair Promise, PhD, MD  This document serves as a record of services personally performed by Gery Pray, MD. It was created on his behalf by Clerance Lav, a trained medical scribe. The creation of this record is based on the scribe's personal observations and the provider's statements to them. This document has been checked and approved by the  attending provider.

## 2019-06-03 ENCOUNTER — Ambulatory Visit
Admission: RE | Admit: 2019-06-03 | Discharge: 2019-06-03 | Disposition: A | Discharge status: Home / Self Care | Payer: BC Managed Care – PPO | Source: Ambulatory Visit | Attending: Radiation Oncology | Admitting: Radiation Oncology

## 2019-06-03 ENCOUNTER — Ambulatory Visit: Payer: BC Managed Care – PPO

## 2019-06-03 ENCOUNTER — Other Ambulatory Visit: Payer: Self-pay

## 2019-06-03 VITALS — BP 126/79 | HR 78 | Temp 98.0°F | Resp 20 | Ht 64.0 in | Wt 142.0 lb

## 2019-06-03 DIAGNOSIS — Z79811 Long term (current) use of aromatase inhibitors: Secondary | ICD-10-CM | POA: Diagnosis not present

## 2019-06-03 DIAGNOSIS — Z170 Estrogen receptor positive status [ER+]: Secondary | ICD-10-CM | POA: Insufficient documentation

## 2019-06-03 DIAGNOSIS — Z923 Personal history of irradiation: Secondary | ICD-10-CM | POA: Insufficient documentation

## 2019-06-03 DIAGNOSIS — R42 Dizziness and giddiness: Secondary | ICD-10-CM | POA: Diagnosis not present

## 2019-06-03 DIAGNOSIS — R5383 Other fatigue: Secondary | ICD-10-CM | POA: Insufficient documentation

## 2019-06-03 DIAGNOSIS — C50412 Malignant neoplasm of upper-outer quadrant of left female breast: Secondary | ICD-10-CM | POA: Diagnosis present

## 2019-06-08 ENCOUNTER — Ambulatory Visit: Payer: BC Managed Care – PPO | Attending: General Surgery

## 2019-06-08 ENCOUNTER — Other Ambulatory Visit: Payer: Self-pay

## 2019-06-08 DIAGNOSIS — M25612 Stiffness of left shoulder, not elsewhere classified: Secondary | ICD-10-CM | POA: Insufficient documentation

## 2019-06-08 DIAGNOSIS — R600 Localized edema: Secondary | ICD-10-CM | POA: Insufficient documentation

## 2019-06-08 DIAGNOSIS — M6281 Muscle weakness (generalized): Secondary | ICD-10-CM | POA: Insufficient documentation

## 2019-06-08 DIAGNOSIS — M79622 Pain in left upper arm: Secondary | ICD-10-CM | POA: Diagnosis present

## 2019-06-08 DIAGNOSIS — I890 Lymphedema, not elsewhere classified: Secondary | ICD-10-CM | POA: Diagnosis present

## 2019-06-08 NOTE — Patient Instructions (Signed)

## 2019-06-08 NOTE — Therapy (Signed)
[place], [place], [address] Phone: [phone]   Fax:  [phone]  Physical Therapy Evaluation  Patient Details  Name: Judith Dixon MRN: [id number] Date of Birth: 08-18-1960 Referring Provider (PT): Ave Filter Date: 06/08/2019  PT End of Session - 06/08/19 0947    Visit Number  1    Number of Visits  4    Date for PT Re-Evaluation  07/27/19    PT Start Time  0915    PT Stop Time  0945    PT Time Calculation (min)  30 min    Activity Tolerance  Patient tolerated treatment well    Behavior During Therapy  [facility] for tasks assessed/performed       Past Medical History:  Diagnosis Date  . Allergy   . Breast cancer [facility])    left breast cancer diagnosed 01-05-2019- surgery 02-10-2019  . Complication of anesthesia    hard time waking up  . Family history of breast cancer   . Family history of colon cancer   . Family history of prostate cancer   . Motion sickness     Past Surgical History:  Procedure Laterality Date  . BREAST LUMPECTOMY WITH RADIOACTIVE SEED AND SENTINEL LYMPH NODE BIOPSY Left 02/10/2019   Procedure: LEFT BREAST LUMPECTOMY WITH RADIOACTIVE SEED AND LEFT AXILLARY SENTINEL LYMPH NODE BIOPSY;  Surgeon: Rolm Bookbinder, MD;  Location: [place];  Service: General;  Laterality: Left;  . ENDOMETRIAL ABLATION    . WISDOM TOOTH EXTRACTION     age [age]  . WRIST SURGERY      There were no vitals filed for this visit.   Subjective Assessment - 06/08/19 0915    Subjective  Pt states that she has been working in her garden and has noticed her L arm getting heavy/achy as well as increased indentations in her L breast. She states that her spouse has noticed a difference in her L arm arm from her R arm and states that it looks bigger.    Pertinent History  L breast cancer s/p L lumpectomy and SLNB (4 nodes all negative) on 02/10/19, she has finished radiation    Patient Stated Goals  I want to understand lymphedema and to be able to treat it myself.    Currently in Pain?  Yes    Pain Score  1     Pain Location  Arm    Pain Orientation  Left;Upper    Pain Descriptors / Indicators  Aching    Pain Type  Chronic pain    Pain Onset  More than a month ago    Aggravating Factors   working in the garden out in the heat and exerting herself.    Pain Relieving Factors  rest    Effect of Pain on Daily Activities  Pt is able to perform all of her normal activities with pain         [facility] PT Assessment - 06/08/19 0001      Assessment   Medical Diagnosis  left breast cancer    Referring Provider (PT)  [name]    Onset Date/Surgical Date  02/10/19    Hand Dominance  Right    Prior Therapy  none      Precautions   Precautions  Other (comment)    Precaution Comments  at risk for lymphedema      Restrictions   Weight Bearing Restrictions  No      Balance  Screen   Has the patient fallen in the past 6 months  No    Has the patient had a decrease in activity level because of a fear of falling?   No    Is the patient reluctant to leave their home because of a fear of falling?   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, constant reaching    Leisure  pt has not been exercising recently      Cognition   Overall Cognitive Status  Within Functional Limits for tasks assessed      Observation/Other Assessments   Observations  left breast with hematoma and fibrosis present, left axillary scar with fibrosis present      Posture/Postural Control   Posture/Postural Control  No significant limitations      ROM / Strength   AROM / PROM /  Strength  AROM      AROM   AROM Assessment Site  Shoulder    Right/Left Shoulder  Right;Left    Left Shoulder Flexion  170 Degrees    Left Shoulder ABduction  171 Degrees    Left Shoulder Internal Rotation  86 Degrees    Left Shoulder External Rotation  78 Degrees        LYMPHEDEMA/ONCOLOGY QUESTIONNAIRE - 06/08/19 0924      Type   Cancer Type  left breast cancer      Surgeries   Lumpectomy Date  02/10/19    Sentinel Lymph Node Biopsy Date  02/10/19    Number Lymph Nodes Removed  4   all negative     Date Lymphedema/Swelling Started   Date  02/10/19      Treatment   Active Chemotherapy Treatment  No    Past Chemotherapy Treatment  No    Active Radiation Treatment  No    Past Radiation Treatment  Yes    Body Site  L breast and axilla    Current Hormone Treatment  Yes    Drug Name  Anastrozole      What other symptoms do you have   Are you Having Heaviness or Tightness  Yes    Are you having Pain  Yes    Are you having pitting edema  No    Is it Hard or Difficult finding clothes that fit  No    Do you have infections  No    Is there Decreased scar mobility  Yes      Lymphedema Stage   Stage  STAGE 1 SPONTANEOUSLY REVERSIBLE      Lymphedema Assessments   Lymphedema Assessments  Upper extremities      Left Upper Extremity Lymphedema   15 cm Proximal to Olecranon Process  29.8 cm    10 cm Proximal to Olecranon Process  28.8 cm    Olecranon Process  24.1 cm    15 cm Proximal to Ulnar Styloid Process  23.9 cm    10 cm Proximal to Ulnar Styloid Process  21 cm    Just Proximal to Ulnar Styloid Process  15 cm    Across  Hand at [employer]  18 cm    At [employer] of 2nd Digit  5.9 cm             Outpatient Rehab from 03/05/2019 in [place]  Lymphedema Life Impact Scale Total Score  35.29 %      Objective measurements completed on examination: See above findings.              PT Education - 06/08/19 [phone]     Education Details  Pt was educated on signs/symptoms of lymphedema and discussed lymphedema vs. post radiation/post surgical swelling. Pt was educated on stages of lymphedema including stage one which is most likely what she is experiencing. Pt was provided with handout and discussed correct sequencing/direction for manual lymph drainage of the LUE. Pt was provided with a script and discussed compression for the LUE and breast for the next month to help support the lymphatic system to decrease swelling while she is healing. Pt was educated that after that anytime she is performing repetitive tasks or will be sweating she should wear her compression.    Person(s) Educated  Patient    Methods  Explanation;Demonstration;Verbal cues;Handout    Comprehension  Verbalized understanding;Returned demonstration          PT Long Term Goals - 06/08/19 0951      PT LONG TERM GOAL #1   Title  Pt will be independent with self MLD of the LUE in order to manage lymphedema at home and for autonomy of care.    Baseline  pt knows how to peform MLD of the L breast.    Time  6    Period  Weeks    Status  New    Target Date  07/27/19      PT LONG TERM GOAL #2   Title  Pt will have compression bra/sleeve that fits appropriately to help her manage edema at home.    Baseline  Pt currently has no compression.    Time  6    Period  Weeks    Status  New    Target Date  07/27/19      PT LONG TERM GOAL #3   Title  Pt will report 50% improvement in pain in the L upper arm when working in her yard or at her job in order to demonstrate improve subjective quality of life.    Baseline  pt reports aching pain in her UE and heaviness.    Time  6    Period  Weeks    Status  New    Target Date  07/27/19      PT LONG TERM GOAL #4   Title  Pt will demonstrate 0.5 cm reduction in edema at the proximal L brachium within 6 weeks to demonstrate decreased edema.    Baseline  see measurements    Time  6    Period  Weeks     Status  New    Target Date  07/27/19             Plan - 06/08/19 0947    Clinical Impression Statement  Pt presents to physicla therapy after her discharge 2 months ago due to she is experiencing increased fluid and aching in her L arm/breast following radiation. Pt circumferential measurements are slightly large in the L proximal brachium. Her hematoma has decreased but will most likely benefit from compression. Discussed the use of compression with patient and POC. Pt ROM has  improved since her discharge from physical therapy. She was educated on MLD of the LUE this session. Pt will benefit from skilled physical therapy services in order to address the above concerns 1x/every other week for 6 weeks in order to decrease risk for increased fluid and infection.    Stability/Clinical Decision Making  Stable/Uncomplicated    Clinical Decision Making  Low    Rehab Potential  Excellent    PT Frequency  Biweekly    PT Duration  6 weeks    PT Treatment/Interventions  Therapeutic exercise;Neuromuscular re-education;Therapeutic activities;Patient/family education;Manual techniques    PT Next Visit Plan  Begin performing MLD for the LUE and teach patient going over what was taught at the initial evaluation, has she received a compression sleeve? or bra? how does it fit    PT Home Exercise Plan  self MLD for the LUE    Recommended Other Services  pt was provided a script for compression sleeve and bra    Consulted and Agree with Plan of Care  Patient       Patient will benefit from skilled therapeutic intervention in order to improve the following deficits and impairments:  Increased edema, Pain  Visit Diagnosis: Lymphedema, not elsewhere classified  Pain in left upper arm - Plan: PT plan of care cert/re-cert     Problem List Patient Active Problem List   Diagnosis Date Noted  . Genetic testing 01/27/2019  . Family history of breast cancer   . Family history of prostate cancer   .  Family history of colon cancer   . Malignant neoplasm of upper-outer quadrant of left breast in female, estrogen receptor positive ([place]) 01/13/2019  . Trigger finger of left thumb 03/03/2018  . Trigger middle finger of left hand 03/03/2018    Ander Purpura, PT 06/08/2019, 9:56 AM  [place], [place], [address] Phone: [phone]   Fax:  [phone]  Name: Judith Dixon MRN: [id number] Date of Birth: Jun 12, 1960

## 2019-06-11 ENCOUNTER — Other Ambulatory Visit: Payer: Self-pay

## 2019-06-11 ENCOUNTER — Ambulatory Visit: Payer: BC Managed Care – PPO | Admitting: Rehabilitation

## 2019-06-11 ENCOUNTER — Encounter: Payer: Self-pay | Admitting: Rehabilitation

## 2019-06-11 DIAGNOSIS — I890 Lymphedema, not elsewhere classified: Secondary | ICD-10-CM | POA: Diagnosis not present

## 2019-06-11 NOTE — Therapy (Signed)
[place], [place], [address] Phone: [phone]   Fax:  [phone]  Physical Therapy Treatment  Patient Details  Name: Judith Dixon MRN: [id number] Date of Birth: [birth year] Referring Provider (PT): Ave Filter Date: 06/11/2019  PT End of Session - 06/11/19 0955    Visit Number  2    Number of Visits  4    Date for PT Re-Evaluation  07/27/19    PT Start Time  0900    PT Stop Time  0947    PT Time Calculation (min)  47 min    Activity Tolerance  Patient tolerated treatment well    Behavior During Therapy  [facility] for tasks assessed/performed       Past Medical History:  Diagnosis Date  . Allergy   . Breast cancer [facility])    left breast cancer diagnosed 01-05-2019- surgery 02-10-2019  . Complication of anesthesia    hard time waking up  . Family history of breast cancer   . Family history of colon cancer   . Family history of prostate cancer   . Motion sickness     Past Surgical History:  Procedure Laterality Date  . BREAST LUMPECTOMY WITH RADIOACTIVE SEED AND SENTINEL LYMPH NODE BIOPSY Left 02/10/2019   Procedure: LEFT BREAST LUMPECTOMY WITH RADIOACTIVE SEED AND LEFT AXILLARY SENTINEL LYMPH NODE BIOPSY;  Surgeon: Rolm Bookbinder, MD;  Location: [facility];  Service: General;  Laterality: Left;  . ENDOMETRIAL ABLATION    . WISDOM TOOTH EXTRACTION     age [age]  . WRIST SURGERY      There were no vitals filed for this visit.  Subjective Assessment - 06/11/19 0906    Subjective  I am doing ok.  I have done the massage once.  I do have some questions    Pertinent History  L breast cancer s/p L lumpectomy and SLNB (4 nodes all negative) on 02/10/19, she has finished radiation    Currently in Pain?  No/denies                  [facility]  Lymphedema Life Impact Scale Total Score  35.29 %            [facility] Adult PT Treatment/Exercise - 06/11/19 0001      Manual Therapy   Edema Management  Pt will be getting measured for sleeve today after this appointment.  Answered all pt questions about not wanting to wear the sleeve when at the beach in [place] and pt was educated that this was ok and you may just need an extra week at home    Manual Lymphatic Drainage (MLD)  PT performed MLD for the left UE with pt performing aspects as needed with feeedback from PT mainly to decrease skin sliding and to move more towards the aixilla instead of the shoulder.  Short neck, bil axillary nodes, L inguinal nodes, establishing anterior interaxillary anastamosis and Lt axillo inguinal anastamosis, Then Left upper extremity from shoulder to wrist and then reversing all pathways.  10 deep breaths.  Then posterior interaxillary pathway in Rt sidelying                   PT Long Term Goals - 06/08/19 0951      PT LONG TERM GOAL #1   Title  Pt will be independent with self MLD of the LUE in order to manage lymphedema at  home and for autonomy of care.    Baseline  pt knows how to peform MLD of the L breast.    Time  6    Period  Weeks    Status  New    Target Date  07/27/19      PT LONG TERM GOAL #2   Title  Pt will have compression bra/sleeve that fits appropriately to help her manage edema at home.    Baseline  Pt currently has no compression.    Time  6    Period  Weeks    Status  New    Target Date  07/27/19      PT LONG TERM GOAL #3   Title  Pt will report 50% improvement in pain in the L upper arm when working in her yard or at her job in order to demonstrate improve subjective quality of life.    Baseline  pt reports aching pain in her UE and heaviness.    Time  6    Period  Weeks    Status  New    Target Date  07/27/19      PT LONG TERM GOAL #4   Title  Pt will demonstrate 0.5 cm reduction in edema at the proximal L brachium within 6 weeks to demonstrate decreased edema.     Baseline  see measurements    Time  6    Period  Weeks    Status  New    Target Date  07/27/19            Plan - 06/11/19 0956    Clinical Impression Statement  First session of MLD for the Lt UE today with review of self MLD techniques and pathways neededing cueing for decreased skin sliding and for steps.  No edema noted today with pt reporting overall it has felt pretty back to baseline.  pt will be getting her sleeve today and bra as needed.    PT Frequency  Biweekly    PT Duration  6 weeks    PT Treatment/Interventions  Therapeutic exercise;Neuromuscular re-education;Therapeutic activities;Patient/family education;Manual techniques    PT Next Visit Plan  MLD for the LUE include posterior as pt appreciated this and continue with self MLD questions, has she received a compression sleeve? or bra? how does it fit    PT Home Exercise Plan  self MLD for the LUE       Patient will benefit from skilled therapeutic intervention in order to improve the following deficits and impairments:     Visit Diagnosis: Lymphedema, not elsewhere classified     Problem List Patient Active Problem List   Diagnosis Date Noted  . Genetic testing 01/27/2019  . Family history of breast cancer   . Family history of prostate cancer   . Family history of colon cancer   . Malignant neoplasm of upper-outer quadrant of left breast in female, estrogen receptor positive ([place]) 01/13/2019  . Trigger finger of left thumb 03/03/2018  . Trigger middle finger of left hand 03/03/2018    Stark Bray 06/11/2019, 9:59 AM  [place], [place], [address] Phone: [phone]   Fax:  [phone]  Name: Brittlyn Romani MRN: [id number] Date of Birth: [birth year]

## 2019-06-23 ENCOUNTER — Other Ambulatory Visit: Payer: Self-pay

## 2019-06-23 ENCOUNTER — Ambulatory Visit: Payer: BC Managed Care – PPO

## 2019-06-23 DIAGNOSIS — I890 Lymphedema, not elsewhere classified: Secondary | ICD-10-CM | POA: Diagnosis not present

## 2019-06-23 DIAGNOSIS — M79622 Pain in left upper arm: Secondary | ICD-10-CM

## 2019-06-23 DIAGNOSIS — M25612 Stiffness of left shoulder, not elsewhere classified: Secondary | ICD-10-CM

## 2019-06-23 DIAGNOSIS — R600 Localized edema: Secondary | ICD-10-CM

## 2019-06-23 DIAGNOSIS — M6281 Muscle weakness (generalized): Secondary | ICD-10-CM

## 2019-06-23 NOTE — Therapy (Signed)
[place], [place], [address] Phone: [phone]   Fax:  [phone]  Physical Therapy Treatment  Patient Details  Name: Judith Dixon MRN: [id number] Date of Birth: Jan 26, 1961 Referring Provider (PT): Ave Filter Date: 06/23/2019  PT End of Session - 06/23/19 1310    Visit Number  3    Number of Visits  4    Date for PT Re-Evaluation  07/27/19    PT Start Time  1306    PT Stop Time  1359    PT Time Calculation (min)  53 min    Activity Tolerance  Patient tolerated treatment well    Behavior During Therapy  [facility] for tasks assessed/performed       Past Medical History:  Diagnosis Date  . Allergy   . Breast cancer [facility])    left breast cancer diagnosed 01-05-2019- surgery 02-10-2019  . Complication of anesthesia    hard time waking up  . Family history of breast cancer   . Family history of colon cancer   . Family history of prostate cancer   . Motion sickness     Past Surgical History:  Procedure Laterality Date  . BREAST LUMPECTOMY WITH RADIOACTIVE SEED AND SENTINEL LYMPH NODE BIOPSY Left 02/10/2019   Procedure: LEFT BREAST LUMPECTOMY WITH RADIOACTIVE SEED AND LEFT AXILLARY SENTINEL LYMPH NODE BIOPSY;  Surgeon: Rolm Bookbinder, MD;  Location: [place];  Service: General;  Laterality: Left;  . ENDOMETRIAL ABLATION    . WISDOM TOOTH EXTRACTION     age [age]  . WRIST SURGERY      There were no vitals filed for this visit.  Subjective Assessment - 06/23/19 1308    Subjective  Pt states that she has some aching in her arm. She is waiting on her other sleeves    Pertinent History  L breast cancer s/p L lumpectomy and SLNB (4 nodes all negative) on 02/10/19, she has finished radiation    Patient Stated Goals  I want to understand lymphedema and to be able to treat it myself.    Currently in Pain?  Yes    Pain Score  1     Pain Location  Arm    Pain Orientation   Left;Upper    Pain Descriptors / Indicators  Aching    Pain Type  Chronic pain    Pain Onset  More than a month ago                   Outpatient [facility]  Lymphedema Life Impact Scale Total Score  35.29 %           [facility] Adult PT Treatment/Exercise - 06/23/19 0001      Manual Therapy   Manual Therapy  Edema management;Soft tissue mobilization;Manual Lymphatic Drainage (MLD)    Edema Management  assessed pt sleeve and discussed appropriate wear including avoiding folding at the top and wear at styloid process to prevent fluid build up in the hand and arm.     Soft tissue mobilization  to the L biceps, triceps  and brachialis; significant improvement in tightness/tenderness following light to moderate STM.     Manual Lymphatic Drainage (MLD)  In supine: short neck, swimming in the terminus, bil axillary and L inguinal nodes, L axillo-inguinal anastomosis, anterior inter-axillary anastomosis, L shoulder, lateral brachium, medial to lateral brachium, lateral brachium, re-worked anastomosis, antecutibal fossa, anterior/posterior antebrachium, re-worked arm from wrist  to shoulder anterior/posterior, L shoulder, re-worked anastomosis then deep abdominals; Extra time spent at the posterior/lateral brachium,              PT Education - 06/23/19 1359    Education Details  Pt will continue with exercises at home and wearing her compression at this time.    Person(s) Educated  Patient    Methods  Explanation    Comprehension  Verbalized understanding          PT Long Term Goals - 06/08/19 0951      PT LONG TERM GOAL #1   Title  Pt will be independent with self MLD of the LUE in order to manage lymphedema at home and for autonomy of care.    Baseline  pt knows how to peform MLD of the L breast.    Time  6    Period  Weeks    Status  New    Target Date  07/27/19      PT LONG TERM GOAL #2   Title  Pt will  have compression bra/sleeve that fits appropriately to help her manage edema at home.    Baseline  Pt currently has no compression.    Time  6    Period  Weeks    Status  New    Target Date  07/27/19      PT LONG TERM GOAL #3   Title  Pt will report 50% improvement in pain in the L upper arm when working in her yard or at her job in order to demonstrate improve subjective quality of life.    Baseline  pt reports aching pain in her UE and heaviness.    Time  6    Period  Weeks    Status  New    Target Date  07/27/19      PT LONG TERM GOAL #4   Title  Pt will demonstrate 0.5 cm reduction in edema at the proximal L brachium within 6 weeks to demonstrate decreased edema.    Baseline  see measurements    Time  6    Period  Weeks    Status  New    Target Date  07/27/19            Plan - 06/23/19 1309    Clinical Impression Statement  Pt presents with continued aching in her L brachium. Tightness/tenderness noted in the triceps, biceps and brachialis muscultaure; decreased significantly following light to moderate pressure STM following the muscle fiber distribution. MLD was performed for the LUE following STM in order to facilitate fluid flow out of the area and to help decrease edema; extra time sent at the posterior/lateral brachium. Pt reports decreased aching at end of session. Pt will benefit from continued POC at this time.    Examination-Activity Limitations  Lift;Reach Overhead;Carry    Examination-Participation Restrictions  Cleaning    Rehab Potential  Excellent    PT Frequency  Biweekly    PT Duration  6 weeks    PT Treatment/Interventions  Therapeutic exercise;Neuromuscular re-education;Therapeutic activities;Patient/family education;Manual techniques    PT Next Visit Plan  MLD for the LUE include posterior as pt appreciated this and continue with self MLD questions, has she received a compression sleeve? or bra? how does it fit    PT Home Exercise Plan  self MLD for the  LUE    Consulted and Agree with Plan of Care  Patient       Patient will  benefit from skilled therapeutic intervention in order to improve the following deficits and impairments:  Increased edema, Pain  Visit Diagnosis: Lymphedema, not elsewhere classified  Pain in left upper arm  Localized edema  Stiffness of left shoulder, not elsewhere classified  Muscle weakness (generalized)     Problem List Patient Active Problem List   Diagnosis Date Noted  . Genetic testing 01/27/2019  . Family history of breast cancer   . Family history of prostate cancer   . Family history of colon cancer   . Malignant neoplasm of upper-outer quadrant of left breast in female, estrogen receptor positive ([place]) 01/13/2019  . Trigger finger of left thumb 03/03/2018  . Trigger middle finger of left hand 03/03/2018    Ander Purpura, PT 06/23/2019, 2:02 PM  [place], [place], [address] Phone: [phone]   Fax:  [phone]  Name: Judith Dixon MRN: [id number] Date of Birth: 1961/01/24

## 2019-06-25 ENCOUNTER — Encounter (INDEPENDENT_AMBULATORY_CARE_PROVIDER_SITE_OTHER): Payer: Self-pay

## 2019-06-25 ENCOUNTER — Telehealth: Payer: Self-pay | Admitting: Adult Health

## 2019-06-25 ENCOUNTER — Other Ambulatory Visit: Payer: Self-pay | Admitting: Adult Health

## 2019-06-25 NOTE — Telephone Encounter (Signed)
Called patient and talked to her about my chart care companion survey.    Judith Bihari, NP

## 2019-06-30 ENCOUNTER — Other Ambulatory Visit: Payer: Self-pay

## 2019-06-30 ENCOUNTER — Inpatient Hospital Stay: Payer: BC Managed Care – PPO | Attending: Hematology and Oncology | Admitting: Adult Health

## 2019-06-30 ENCOUNTER — Telehealth: Payer: Self-pay | Admitting: Adult Health

## 2019-06-30 VITALS — BP 119/64 | HR 76 | Temp 98.7°F | Resp 18 | Ht 64.0 in | Wt 140.5 lb

## 2019-06-30 DIAGNOSIS — C50412 Malignant neoplasm of upper-outer quadrant of left female breast: Secondary | ICD-10-CM | POA: Diagnosis not present

## 2019-06-30 DIAGNOSIS — Z170 Estrogen receptor positive status [ER+]: Secondary | ICD-10-CM | POA: Diagnosis not present

## 2019-06-30 MED ORDER — GABAPENTIN 100 MG PO CAPS: 100.0000 mg | ORAL_CAPSULE | Freq: Every day | ORAL | 0 refills | Status: DC

## 2019-06-30 NOTE — Progress Notes (Signed)
SURVIVORSHIP VISIT:   BRIEF ONCOLOGIC HISTORY:  Oncology History  Malignant neoplasm of upper-outer quadrant of left breast in female, estrogen receptor positive ([place])  01/05/2019 Cancer Staging   Staging form: Breast, AJCC 8th Edition - Clinical stage from 01/05/2019: Stage IA (cT1b, cN0, cM0, G2, ER+, PR+, HER2-)   01/05/2019 Initial Diagnosis   Routine screening mammogram detected a 0.6cm architectural distortion in the left breast. Biopsy showed IDC with DCIS, grade 2, HER-2 - by FISH, ER+ 90%, PR+ 85%, Ki67 15%.   01/27/2019 Genetic Testing   Negative genetic testing. No pathogenic variants identified. VUS in CHEK2 called c.8G>A identified on the [facility] Breast Cancer STAT Panel + Common Hereditary Cancers Panel. The STAT Breast cancer panel offered by [facility] includes sequencing and rearrangement analysis for the following 9 genes:  ATM, BRCA1, BRCA2, CDH1, CHEK2, PALB2, PTEN, STK11 and TP53.  The Common Hereditary Cancers Panel offered by [facility] includes sequencing and/or deletion duplication testing of the following 48 genes: APC, ATM, AXIN2, BARD1, BMPR1A, BRCA1, BRCA2, BRIP1, CDH1, CDKN2A (p14ARF), CDKN2A (p16INK4a), CKD4, CHEK2, CTNNA1, DICER1, EPCAM (Deletion/duplication testing only), GREM1 (promoter region deletion/duplication testing only), KIT, MEN1, MLH1, MSH2, MSH3, MSH6, MUTYH, NBN, NF1, NHTL1, PALB2, PDGFRA, PMS2, POLD1, POLE, PTEN, RAD50, RAD51C, RAD51D, RNF43, SDHB, SDHC, SDHD, SMAD4, SMARCA4. STK11, TP53, TSC1, TSC2, and VHL.  The following genes were evaluated for sequence changes only: SDHA and HOXB13 c.251G>A variant only. The report date is 01/26/2019.    02/10/2019 Cancer Staging   Staging form: Breast, AJCC 8th Edition - Pathologic stage from 02/10/2019: Stage IA (pT1b, pN0, cM0, G2, ER+, PR+, HER2-) - Signed by [name], NP on 02/24/2019   02/10/2019 Surgery   Left lumpectomy [name]) [phone]): IDC, grade 2, 0.6cm, with intermediate grade DCIS,  clear margins, 4 left axillary lymph nodes negative    03/02/2019 Oncotype testing   The Oncotype DX score was 13 predicting a risk of outside the breast recurrence over the next 9 years of 4% if the patient's only systemic therapy is tamoxifen for 5 years.    04/06/2019 - 05/03/2019 Radiation Therapy   The patient initially received a dose of 40.05 Gy in 15 fractions to the breast using whole-breast tangent fields. This was delivered using a 3-D conformal technique. The pt received a boost delivering an additional 12 Gy in 6 fractions using a electron boost with 22mV electrons. The total dose was 52.05 Gy.    04/2019 - 04/2024 Anti-estrogen oral therapy   Anastrozole     INTERVAL HISTORY:  [name] review her survivorship care plan detailing her treatment course for breast cancer, as well as monitoring long-term side effects of that treatment, education regarding health maintenance, screening, and overall wellness and health promotion.     Overall, Ms. PZwickreports feeling quite well.  She completed the survivorship survey which noted a positive distress screen, a positive cognitive dysfunction screen, positive for fatigue, at a level 8, hot flashes which are causing sleep issues, which are contributing to the fatigue.  She has some left arm lymphedema stage 1 and has been seeing PT and wearing a sleeve.    REVIEW OF SYSTEMS:  Review of Systems  Constitutional: Positive for fatigue. Negative for appetite change, chills, fever and unexpected weight change.  HENT:   Negative for hearing loss and lump/mass.   Eyes: Negative for eye problems and icterus.  Respiratory: Negative for chest tightness, cough and shortness of breath.   Cardiovascular: Negative for chest pain, leg swelling and  palpitations.  Gastrointestinal: Negative for abdominal distention and abdominal pain.  Endocrine: Positive for hot flashes.  Genitourinary: Negative for difficulty urinating.   Musculoskeletal: Positive  for arthralgias.  Neurological: Negative for dizziness, extremity weakness, headaches and numbness.  Hematological: Negative for adenopathy. Does not bruise/bleed easily.  Psychiatric/Behavioral: Positive for decreased concentration and sleep disturbance. Negative for depression and suicidal ideas. The patient is not nervous/anxious.   Breast: Denies any new nodularity, masses, tenderness, nipple changes, or nipple discharge.      ONCOLOGY TREATMENT TEAM:  1. Surgeon:  Dr. [name] at [facility] 2. Medical Oncologist: Dr. [name]  3. Radiation Oncologist: Dr. Sondra Come    PAST MEDICAL/SURGICAL HISTORY:  Past Medical History:  Diagnosis Date  . Allergy   . Breast cancer [facility])    left breast cancer diagnosed 01-05-2019- surgery 02-10-2019  . Complication of anesthesia    hard time waking up  . Family history of breast cancer   . Family history of colon cancer   . Family history of prostate cancer   . Motion sickness    Past Surgical History:  Procedure Laterality Date  . BREAST LUMPECTOMY WITH RADIOACTIVE SEED AND SENTINEL LYMPH NODE BIOPSY Left 02/10/2019   Procedure: LEFT BREAST LUMPECTOMY WITH RADIOACTIVE SEED AND LEFT AXILLARY SENTINEL LYMPH NODE BIOPSY;  Surgeon: Rolm Bookbinder, MD;  Location: [place];  Service: General;  Laterality: Left;  . ENDOMETRIAL ABLATION    . WISDOM TOOTH EXTRACTION     age 24-20  . WRIST SURGERY       ALLERGIES:  No Active Allergies   CURRENT MEDICATIONS:  Outpatient Encounter Medications as of 06/30/2019  Medication Sig  . anastrozole (ARIMIDEX) 1 MG tablet Take 0.5 tablets (0.5 mg total) by mouth daily.  . Biotin 1 MG CAPS Take by mouth.  . Cholecalciferol (VITAMIN D3) 50 MCG (2000 UT) capsule Take 2,000 Units by mouth daily.  Marland Kitchen EPINEPHrine 0.3 mg/0.3 mL IJ SOAJ injection USE AS DIRECTED FOR SEVERE ALLERGIC REACTIONS  . Multiple Vitamin (MULTIVITAMIN) tablet Take 1 tablet by mouth daily.  Marland Kitchen OVER THE COUNTER  MEDICATION Natural vitamin for hair growth   No facility-administered encounter medications on file as of 06/30/2019.     ONCOLOGIC FAMILY HISTORY:  Family History  Problem Relation Age of Onset  . Colon cancer Maternal Grandmother 22  . Colon polyps Mother   . Thyroid disease Mother   . Hypertension Mother   . Colon polyps Father   . Prostate cancer Father   . Arthritis Father   . Hypertension Father   . Breast cancer Maternal Aunt        dx 99s  . Breast cancer Cousin 7       maternal first cousin  . Aneurysm Paternal Uncle   . Skin cancer Maternal Grandfather   . Leukemia Paternal Grandmother   . Esophageal cancer Neg Hx   . Rectal cancer Neg Hx   . Stomach cancer Neg Hx      GENETIC COUNSELING/TESTING: See above  SOCIAL HISTORY:  Social History   Socioeconomic History  . Marital status: Married    Spouse name: Not on file  . Number of children: Not on file  . Years of education: Not on file  . Highest education level: Not on file  Occupational History  . 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 Smoking status: Never Smoker  . Smokeless tobacco: Never Used  Substance  and Sexual Activity  . Alcohol use: Yes    Comment: rare   . Drug use: Never  . Sexual activity: Not on file    Comment: husband has had vasectomy, pt has had an ablation  Other Topics Concern  . Not on file  Social History Narrative  . Not on file   Social Determinants of Health   Financial Resource Strain:   . Difficulty of Paying Living Expenses:   Food Insecurity:   . Worried About Charity fundraiser in the Last Year:   . Arboriculturist in the Last Year:   Transportation Needs:   . Film/video editor (Medical):   Marland Kitchen Lack of Transportation (Non-Medical):   Physical Activity:   . Days of Exercise per Week:   . Minutes of Exercise per Session:   Stress:   . Feeling of Stress :   Social Connections:   . Frequency of Communication  with Friends and Family:   . Frequency of Social Gatherings with Friends and Family:   . Attends Religious Services:   . Active Member of Clubs or Organizations:   . Attends Archivist Meetings:   Marland Kitchen Marital Status:   Intimate Partner Violence:   . Fear of Current or Ex-Partner:   . Emotionally Abused:   Marland Kitchen Physically Abused:   . Sexually Abused:      OBSERVATIONS/OBJECTIVE:  BP 119/64   Pulse 76   Temp 98.7 F (37.1 C)   Resp 18   Ht _0  (1.626 m)   Wt 140 lb 8 oz (63.7 kg)   BMI 24.12 kg/m  GENERAL: Patient is a well appearing female in no acute distress HEENT:  Sclerae anicteric.  Oropharynx clear and moist. No ulcerations or evidence of oropharyngeal candidiasis. Neck is supple.  NODES:  No cervical, supraclavicular, or axillary lymphadenopathy palpated.  BREAST EXAM:  Left breast s/p lumpectomy and radiation, no sign of local recurrence. LUNGS:  Clear to auscultation bilaterally.  No wheezes or rhonchi. HEART:  Regular rate and rhythm. No murmur appreciated. ABDOMEN:  Soft, nontender.  Positive, normoactive bowel sounds. No organomegaly palpated. MSK:  No focal spinal tenderness to palpation.  EXTREMITIES:  No peripheral edema.   SKIN:  Clear with no obvious rashes or skin changes. No nail dyscrasia. NEURO:  Nonfocal. Well oriented.  Appropriate affect.    LABORATORY DATA:  None for this visit.  DIAGNOSTIC IMAGING:  None for this visit.      ASSESSMENT AND PLAN:  Ms.. Coughlin is a pleasant 59 y.o. female with Stage IA left breast invasive ductal carcinoma, ER+/PR+/HER2-, diagnosed in 01/2019, treated with lumpectomy, adjuvant radiation therapy, and anti-estrogen therapy with Anastrozole beginning in 04/2019.  She presents to the Survivorship Clinic for our initial meeting and routine follow-up post-completion of treatment for breast cancer.    1. Stage IA left breast cancer:  Judith Dixon is continuing to recover from definitive treatment for breast  cancer. She will follow-up with her medical oncologist, Dr. [name] in  with history and physical exam per surveillance protocol.  She will continue her anti-estrogen therapy with Anastrozole. Thus far, she is tolerating the Anastrozole well, with minimal side effects. She was instructed to make Dr. [name] or myself aware if she begins to experience any worsening side effects of the medication and I could see her back in clinic to help manage those side effects, as needed. Her mammogram is due 11/2019; orders placed today. Today, a comprehensive survivorship care plan  and treatment summary was reviewed with the patient today detailing her breast cancer diagnosis, treatment course, potential late/long-term effects of treatment, appropriate follow-up care with recommendations for the future, and patient education resources.  A copy of this summary, along with a letter will be sent to the patient's primary care provider via mail/fax/In Basket message after today's visit.    2. Positive distress screen: She was given the QOL CSV form to complete and that was sent to social work to score and f/u with her about.    3. Cognitive dysfunction: I reviewed with her Dr. Mickeal Skinner, our neuro oncologist who is on staff and happy to see her if this continues.  She will let us know if she wants an appointment with him.  4. Hot flashes: This is causing a majority of her issues, because it is interfering with her ability to sleep.  I wrote for her to take Gabapentin 138m to 3076mnightly as needed.  She is going to try this.    5. Bone health:  Given Ms. Metzinger's age/history of breast cancer and her current treatment regimen including anti-estrogen therapy with Anastrozole, she is at risk for bone demineralization.  Her last DEXA scan was 06/2019, which showed osteopenia with a t score of -2.0.  She does not want to start a bisphosphanate and is going to work on her calcium intake.  She is already taking vitamin d and will  continue with her activity and exercise. She was given education on specific activities to promote bone health.  6. Cancer screening:  Due to Ms. Miskell's history and her age, she should receive screening for skin cancers, colon cancer, and gynecologic cancers.  The information and recommendations are listed on the patient's comprehensive care plan/treatment summary and were reviewed in detail with the patient.    7. Health maintenance and wellness promotion: Ms. PoDickas encouraged to consume 5-7 servings of fruits and vegetables per day. We reviewed the "Nutrition Rainbow" handout, as well as the handout "Take Control of Your Health and Reduce Your Cancer Risk" from the [employer] She was also encouraged to engage in moderate to vigorous exercise for 30 minutes per day most days of the week. We discussed the [employer] fitness program, which is designed for cancer survivors to help them become more physically fit after cancer treatments.  She was instructed to limit her alcohol consumption and continue to abstain from tobacco use.     8. Support services/counseling: It is not uncommon for this period of the patient's cancer care trajectory to be one of many emotions and stressors.  We discussed how this can be increasingly difficult during the times of quarantine and social distancing due to the COVID-19 pandemic.   She was given information regarding our available services and encouraged to contact me with any questions or for help enrolling in any of our support group/programs.    Follow up instructions:    -Return to cancer center in 6 months for f/u with Dr. GuLindi Adie-Mammogram due in 11/2018 -Follow up with Dr. WaDonne Hazeln 6 months -She is welcome to return back to the Survivorship Clinic at any time; no additional follow-up needed at this time.  -Consider referral back to survivorship as a long-term survivor for continued surveillance  The patient was provided an  opportunity to ask questions and all were answered. The patient agreed with the plan and demonstrated an understanding of the instructions.   Total encounter time: 60 minutes  Wilber Bihari, NP 06/30/19 8:13 AM Medical Oncology and Hematology [facility] [place], [place] [address] Tel. [phone]    Fax. [phone]  *Total Encounter Time as defined by the Centers for Medicare and Medicaid Services includes, in addition to the face-to-face time of a patient visit (documented in the note above) non-face-to-face time: obtaining and reviewing outside history, ordering and reviewing medications, tests or procedures, care coordination (communications with other health care professionals or caregivers) and documentation in the medical record.

## 2019-06-30 NOTE — Telephone Encounter (Signed)
Scheduled appts per 5/26 los. Left voicemail with appt date and time.

## 2019-07-06 ENCOUNTER — Encounter: Payer: Self-pay | Admitting: Licensed Clinical Social Worker

## 2019-07-06 NOTE — Progress Notes (Signed)
[place] Work  Received return call from patient. Per patient, the hardest things have been feeling isolated with being unable to speak with someone who has had a similar experience, people at work not understanding that she is still recovering, and loss of libido. CSW provided supportive counsel and validation of feelings. Encouraged patient to continue setting boundaries at work and discussed how to slowly increase activity to improve stamina. Discussed libido and ways to further intimacy. CSW emailed links to resources and articles per patient's request. Patient also agreed to sign up for breast cancer support group.  Follow-up quality of life screener will be sent in 1 month.   Edwinna Areola Maddix Heinz, LCSW

## 2019-07-06 NOTE — Progress Notes (Signed)
[facility] Quality of Life Screening Clinical Social Work  Clinical Social Work was referred by survivorship quality of life screening protocol.  The patient scored a 31.9 on the Quality of Life/ Cancer Survivor (QOL-CS) scale which indicates moderate quality of life.   Clinical Social Worker attempted to contact patient by phone to assess for needs. Unable to reach, left VM.  Based on screener, fatigue, anxiety, family distress, sexuality, and isolation are concerns.   CSW will attempt to contact again in the future.      Jenaya Saar E, LCSW

## 2019-07-09 ENCOUNTER — Ambulatory Visit: Payer: BC Managed Care – PPO | Attending: General Surgery

## 2019-07-09 ENCOUNTER — Other Ambulatory Visit: Payer: Self-pay

## 2019-07-09 DIAGNOSIS — I890 Lymphedema, not elsewhere classified: Secondary | ICD-10-CM | POA: Diagnosis present

## 2019-07-09 DIAGNOSIS — M6281 Muscle weakness (generalized): Secondary | ICD-10-CM | POA: Diagnosis present

## 2019-07-09 DIAGNOSIS — M79622 Pain in left upper arm: Secondary | ICD-10-CM

## 2019-07-09 DIAGNOSIS — R600 Localized edema: Secondary | ICD-10-CM | POA: Insufficient documentation

## 2019-07-09 DIAGNOSIS — M25612 Stiffness of left shoulder, not elsewhere classified: Secondary | ICD-10-CM | POA: Insufficient documentation

## 2019-07-09 NOTE — Therapy (Signed)
[place], [place], [address] Phone: [phone]   Fax:  [phone]  Physical Therapy Progress Note  Progress Note Reporting Period 06/08/2019 to 07/09/2019  See note below for Objective Data and Assessment of Progress/Goals.      Patient Details  Name: Charlotte Fidalgo MRN: [id number] Date of Birth: Apr 20, 1960 Referring Provider (PT): Ave Filter Date: 07/09/2019  PT End of Session - 07/09/19 0918    Visit Number  4    Number of Visits  6    Date for PT Re-Evaluation  08/13/19    PT Start Time  0918    PT Stop Time  1011    PT Time Calculation (min)  53 min    Activity Tolerance  Patient tolerated treatment well    Behavior During Therapy  [facility] for tasks assessed/performed       Past Medical History:  Diagnosis Date  . Allergy   . Breast cancer [facility])    left breast cancer diagnosed 01-05-2019- surgery 02-10-2019  . Complication of anesthesia    hard time waking up  . Family history of breast cancer   . Family history of colon cancer   . Family history of prostate cancer   . Motion sickness     Past Surgical History:  Procedure Laterality Date  . BREAST LUMPECTOMY WITH RADIOACTIVE SEED AND SENTINEL LYMPH NODE BIOPSY Left 02/10/2019   Procedure: LEFT BREAST LUMPECTOMY WITH RADIOACTIVE SEED AND LEFT AXILLARY SENTINEL LYMPH NODE BIOPSY;  Surgeon: Rolm Bookbinder, MD;  Location: [place];  Service: General;  Laterality: Left;  . ENDOMETRIAL ABLATION    . WISDOM TOOTH EXTRACTION     age [age]  . WRIST SURGERY      There were no vitals filed for this visit.  Subjective Assessment - 07/09/19 0918    Subjective  Pt states that she knows when she has done too much and she has a bunch of people coming to her house on Wednesday.    Pertinent History  L breast cancer s/p L lumpectomy and SLNB (4 nodes all negative) on 02/10/19, she has finished radiation    Patient Stated  Goals  I want to understand lymphedema and to be able to treat it myself.    Currently in Pain?  Yes    Pain Score  3     Pain Location  Arm    Pain Orientation  Left;Upper    Pain Descriptors / Indicators  Aching    Pain Type  Chronic pain;Surgical pain    Pain Onset  More than a month ago    Pain Frequency  Constant    Aggravating Factors   working on the deck    Pain Relieving Factors  rest    Effect of Pain on Daily Activities  pt is able to perform all of her normal activities with pain.         [facility] PT Assessment - 07/09/19 0001      AROM   Left Shoulder Flexion  175 Degrees    Left Shoulder ABduction  178 Degrees    Left Shoulder Internal Rotation  86 Degrees    Left Shoulder External Rotation  89 Degrees        LYMPHEDEMA/ONCOLOGY QUESTIONNAIRE - 07/09/19 0001      Left Upper Extremity Lymphedema   15 cm Proximal to Olecranon Process  28 cm    10 cm Proximal to Olecranon Process  27.5 cm  Olecranon Process  23 cm    15 cm Proximal to Ulnar Styloid Process  23.6 cm    10 cm Proximal to Ulnar Styloid Process  20.2 cm    Just Proximal to Ulnar Styloid Process  14.8 cm    Across Hand at [employer]  17.2 cm    At [employer] of 2nd Digit  5.7 cm         Quick Dash - 07/09/19 0001    Open a tight or new jar  No difficulty    Do heavy household chores (wash walls, wash floors)  No difficulty    Carry a shopping bag or briefcase  No difficulty    Wash your back  No difficulty    Use a knife to cut food  No difficulty    Recreational activities in which you take some force or impact through your arm, shoulder, or hand (golf, hammering, tennis)  No difficulty    During the past week, to what extent has your arm, shoulder or hand problem interfered with your normal social activities with family, friends, neighbors, or groups?  Slightly    During the past week, to what extent has your arm, shoulder or hand problem limited your work or other regular daily activities   Slightly    Arm, shoulder, or hand pain.  Mild    Tingling (pins and needles) in your arm, shoulder, or hand  Mild    Difficulty Sleeping  No difficulty    DASH Score  9.09 %        Outpatient [facility]  Lymphedema Life Impact Scale Total Score  17.65 %           [facility] Adult PT Treatment/Exercise - 07/09/19 0001      Manual Therapy   Manual Therapy  Manual Lymphatic Drainage (MLD)    Edema Management  Pt was provided with information on [employer] compression bra and measured for a size small. She was given information on where she can purchase this item and how it may help with the edema/hematoma in the L breast.     Manual Lymphatic Drainage (MLD)  In supine: short neck, swimming in the terminus, bil axillary and L inguinal nodes, anterior inter-axillary anastomosis, L axillo-inguinal anastomosis, superior/inferior breast working toward corresponding anastomosis, re-worked anasotmosis spending extra time in this area             PT Education - 07/09/19 1209    Education Details  Pt was provided with information on compression bras including type, size and where to purchase.    Person(s) Educated  Patient    Methods  Explanation    Comprehension  Verbalized understanding          PT Long Term Goals - 07/09/19 1610      PT LONG TERM GOAL #1   Title  Pt will be independent with self MLD of the LUE in order to manage lymphedema at home and for autonomy of care.    Baseline  pt knows how to peform MLD of the L breast.    Status  Achieved      PT LONG TERM GOAL #2   Title  Pt will have compression bra/sleeve that fits appropriately to help her manage edema at home.    Baseline  pt has a compression sleeve but still needs a compressoin bra    Time  4    Period  Weeks  Status  On-going    Target Date  08/13/19      PT LONG TERM GOAL #3   Title  Pt will report 50% improvement in pain in the L upper arm  when working in her yard or at her job in order to demonstrate improve subjective quality of life.    Baseline  Pt reports greater than 50% improvement in pain i nthe L upper arm    Status  Achieved      PT LONG TERM GOAL #4   Title  Pt will demonstrate 0.5 cm reduction in edema at the proximal L brachium within 6 weeks to demonstrate decreased edema.    Baseline  see measurements pt had 1.8 cm decrease    Status  Achieved      PT LONG TERM GOAL #5   Title  Pt will demonstrate 90%  decrease in fibrosis in area of hematoma to avoid needing the area drained.            Plan - 07/09/19 0918    Clinical Impression Statement  Pt presents with aching under her L axilla this session. Circumferential measurements were taken and she has improved significantly since her initial evaluation. She has met all of her previous goals except one. MLD was performed with extra time spent at the L breast this session. Pt was educated on where she an purchase and what type of compression bra is appropriate this session. Pt will benefit from skilled physical therapy services 1x/week EOW for 4 weeks and then discharge to home management.    Examination-Activity Limitations  Lift;Reach Overhead;Carry    Examination-Participation Restrictions  Cleaning    Rehab Potential  Excellent    PT Frequency  Biweekly    PT Duration  6 weeks    PT Treatment/Interventions  Therapeutic exercise;Neuromuscular re-education;Therapeutic activities;Patient/family education;Manual techniques    PT Next Visit Plan  MLD, compression bra?    PT Home Exercise Plan  self MLD for the LUE    Consulted and Agree with Plan of Care  Patient       Patient will benefit from skilled therapeutic intervention in order to improve the following deficits and impairments:  Increased edema, Pain  Visit Diagnosis: Lymphedema, not elsewhere classified  Pain in left upper arm  Localized edema  Stiffness of left shoulder, not elsewhere  classified  Muscle weakness (generalized)     Problem List Patient Active Problem List   Diagnosis Date Noted  . Genetic testing 01/27/2019  . Family history of breast cancer   . Family history of prostate cancer   . Family history of colon cancer   . Malignant neoplasm of upper-outer quadrant of left breast in female, estrogen receptor positive ([place]) 01/13/2019  . Trigger finger of left thumb 03/03/2018  . Trigger middle finger of left hand 03/03/2018    Ander Purpura, PT 07/09/2019, 12:16 PM  [place], [place], [address] Phone: [phone]   Fax:  [phone]  Name: Sherrilynn Gudgel MRN: [id number] Date of Birth: 10-08-1960

## 2019-07-16 ENCOUNTER — Encounter (INDEPENDENT_AMBULATORY_CARE_PROVIDER_SITE_OTHER): Payer: Self-pay

## 2019-07-19 ENCOUNTER — Ambulatory Visit: Payer: BC Managed Care – PPO

## 2019-07-19 ENCOUNTER — Other Ambulatory Visit: Payer: Self-pay

## 2019-07-19 DIAGNOSIS — M25612 Stiffness of left shoulder, not elsewhere classified: Secondary | ICD-10-CM

## 2019-07-19 DIAGNOSIS — I890 Lymphedema, not elsewhere classified: Secondary | ICD-10-CM

## 2019-07-19 DIAGNOSIS — R600 Localized edema: Secondary | ICD-10-CM

## 2019-07-19 DIAGNOSIS — M79622 Pain in left upper arm: Secondary | ICD-10-CM

## 2019-07-19 DIAGNOSIS — M6281 Muscle weakness (generalized): Secondary | ICD-10-CM

## 2019-07-19 NOTE — Therapy (Signed)
[place], [place], [address] Phone: [phone]   Fax:  [phone]  Physical Therapy Treatment  Patient Details  Name: Judith Dixon MRN: [id number] Date of Birth: [birth year] Referring Provider (PT): Ave Filter Date: 07/19/2019   PT End of Session - 07/19/19 0911    Visit Number 5    Number of Visits 6    Date for PT Re-Evaluation 08/13/19    PT Start Time 0910    PT Stop Time 1005    PT Time Calculation (min) 55 min    Activity Tolerance Patient tolerated treatment well    Behavior During Therapy [facility] for tasks assessed/performed           Past Medical History:  Diagnosis Date  . Allergy   . Breast cancer [facility])    left breast cancer diagnosed 01-05-2019- surgery 02-10-2019  . Complication of anesthesia    hard time waking up  . Family history of breast cancer   . Family history of colon cancer   . Family history of prostate cancer   . Motion sickness     Past Surgical History:  Procedure Laterality Date  . BREAST LUMPECTOMY WITH RADIOACTIVE SEED AND SENTINEL LYMPH NODE BIOPSY Left 02/10/2019   Procedure: LEFT BREAST LUMPECTOMY WITH RADIOACTIVE SEED AND LEFT AXILLARY SENTINEL LYMPH NODE BIOPSY;  Surgeon: Rolm Bookbinder, MD;  Location: [place];  Service: General;  Laterality: Left;  . ENDOMETRIAL ABLATION    . WISDOM TOOTH EXTRACTION     age [age]  . WRIST SURGERY      There were no vitals filed for this visit.   Subjective Assessment - 07/19/19 0911    Subjective Pt states that she has been trying to wean out of the sleeve and has started to notice some burning in her posterior upper L arm that feels like nerve pain the compression does seem to help but she hasn't noticed anything else that seems to improve the pain.    Pertinent History L breast cancer s/p L lumpectomy and SLNB (4 nodes all negative) on 02/10/19, she has finished radiation    Patient  Stated Goals I want to understand lymphedema and to be able to treat it myself.    Currently in Pain? Yes    Pain Score 3     Pain Location Arm    Pain Orientation Left;Upper;Posterior    Pain Descriptors / Indicators Burning    Pain Type Surgical pain;Acute pain    Pain Onset 1 to 4 weeks ago    Pain Frequency Intermittent    Aggravating Factors  nothing pt notices    Pain Relieving Factors compression                 LYMPHEDEMA/ONCOLOGY QUESTIONNAIRE - 07/19/19 0001      Left Upper Extremity Lymphedema   15 cm Proximal to Olecranon Process 28 cm    10 cm Proximal to Olecranon Process 27.7 cm    Olecranon Process 23.2 cm    15 cm Proximal to Ulnar Styloid Process 23.3 cm    10 cm Proximal to Ulnar Styloid Process 20.7 cm    Just Proximal to Ulnar Styloid Process 14.8 cm    Across Hand at [employer] 17.5 cm    At [place] of 2nd Digit 5.7 cm                Outpatient Rehab from 07/09/2019 in [place]  Lymphedema Life Impact Scale Total Score 17.65 %            [facility] Adult PT Treatment/Exercise - 07/19/19 0001      Exercises   Exercises Shoulder      Shoulder Exercises: Seated   External Rotation Strengthening;Both;20 reps    Theraband Level (Shoulder External Rotation) Level 2 (Red)    External Rotation Limitations Pt reports fatigue with this exercise, demonstration with return demonstration and VC to squeeze scapula for appropriate movement.     Other Seated Exercises seated biceps curl with red band VC to prevent compensation with brachioradialis with wrist rotation 20x with demonstration    Other Seated Exercises seated triceps extension with red band demonstration for correct movement with red theraband and then demonstration for tricep kick back since pt has weights at home.       Manual Therapy   Manual Therapy Manual Lymphatic Drainage (MLD)    Edema Management Pt was provided with 1/2 inch gray foam to put  over the hematoma in her L breast    Manual Lymphatic Drainage (MLD) In supine: short neck, swimming in the terminus, bil axillary and L inguinal nodes, anterior inter-axillary anastomosis, L axillo-inguinal anastomosis, superior/inferior breast working toward corresponding anastomosis, re-worked anasotmosis spending extra time in this area, lateral L brachium, medial to lateral L brachium, lateral L brachium, L shoulder, re-worked anastomosis then deep abdominals.                   PT Education - 07/19/19 1006    Education Details Access Code: [email or web address], Pt was provided with LUE strengthening exercises. Discussed wearing 1/2 inch gray foam over the hematoma in the L breast.    Person(s) Educated Patient    Methods Explanation;Demonstration    Comprehension Verbalized understanding;Returned demonstration               PT Long Term Goals - 07/09/19 7371      PT LONG TERM GOAL #1   Title Pt will be independent with self MLD of the LUE in order to manage lymphedema at home and for autonomy of care.    Baseline pt knows how to peform MLD of the L breast.    Status Achieved      PT LONG TERM GOAL #2   Title Pt will have compression bra/sleeve that fits appropriately to help her manage edema at home.    Baseline pt has a compression sleeve but still needs a compressoin bra    Time 4    Period Weeks    Status On-going    Target Date 08/13/19      PT LONG TERM GOAL #3   Title Pt will report 50% improvement in pain in the L upper arm when working in her yard or at her job in order to demonstrate improve subjective quality of life.    Baseline Pt reports greater than 50% improvement in pain i nthe L upper arm    Status Achieved      PT LONG TERM GOAL #4   Title Pt will demonstrate 0.5 cm reduction in edema at the proximal L brachium within 6 weeks to demonstrate decreased edema.    Baseline see measurements pt had 1.8 cm decrease    Status Achieved      PT LONG TERM GOAL #5    Title Pt will demonstrate 90%  decrease in fibrosis in area of hematoma to avoid needing the area drained.  Plan - 07/19/19 0911    Clinical Impression Statement Pt is reporting that she has some burning in her triceps area that has started last week; most likely related to nerve healing. CIrcumferential measurements were taken and pt continues at reduced size despite not weraing her compression garment demonstrating good maintenance of swelling in the LUE. MLD was performed with extra time spent at the L breat and she was provided with 1/2 inch gray foam to put over the hematoma to see if this doesn't help facilitate break down and clearance of the hematoma. Pt has recieved her compression bras which fit well. Pt will benefit from continued POC at this time.    Examination-Activity Limitations Lift;Reach Overhead;Carry    Examination-Participation Restrictions Cleaning    Stability/Clinical Decision Making Stable/Uncomplicated    PT Frequency Biweekly    PT Duration 6 weeks    PT Treatment/Interventions Therapeutic exercise;Neuromuscular re-education;Therapeutic activities;Patient/family education;Manual techniques    PT Next Visit Plan MLD, compression bra?    PT Home Exercise Plan self MLD for the LUE    Consulted and Agree with Plan of Care Patient           Patient will benefit from skilled therapeutic intervention in order to improve the following deficits and impairments:  Increased edema, Pain  Visit Diagnosis: Lymphedema, not elsewhere classified  Pain in left upper arm  Localized edema  Stiffness of left shoulder, not elsewhere classified  Muscle weakness (generalized)     Problem List Patient Active Problem List   Diagnosis Date Noted  . Genetic testing 01/27/2019  . Family history of breast cancer   . Family history of prostate cancer   . Family history of colon cancer   . Malignant neoplasm of upper-outer quadrant of left breast in female,  estrogen receptor positive ([place]) 01/13/2019  . Trigger finger of left thumb 03/03/2018  . Trigger middle finger of left hand 03/03/2018    Ander Purpura, PT 07/19/2019, 12:02 PM  [place], [place], [address] Phone: [phone]   Fax:  [phone]  Name: Zauria Dombek MRN: [id number] Date of Birth: 11/26/1960

## 2019-07-19 NOTE — Patient Instructions (Signed)
Access Code: [email or web address] URL: [email or web address] Date: 07/19/2019 Prepared by: Tomma Rakers  Exercises Seated Elbow Extension with Self-Anchored Resistance - 1 x daily - 7 x weekly - 1 sets - 20 reps - you can do this or the standing bent over tricep kick back hold Standing Single Arm Elbow Flexion with Resistance - 1 x daily - 7 x weekly - 1 sets - 20 reps - you can do 4 or 5 lbs with remember palm up hold Standing Bent Over Single Arm Tricep Extension with Dumbbell and PLB - 1 x daily - 7 x weekly - 1 sets - 20 reps - 3 lbs to start with 3 lbs then you can increase. hold Shoulder External Rotation and Scapular Retraction with Resistance - 1 x daily - 7 x weekly - 1 sets - 20 reps - remember squeeze your shoulder blades together and sit up tall. hold

## 2019-07-30 ENCOUNTER — Ambulatory Visit: Payer: BC Managed Care – PPO

## 2019-07-30 ENCOUNTER — Other Ambulatory Visit: Payer: Self-pay

## 2019-07-30 DIAGNOSIS — M6281 Muscle weakness (generalized): Secondary | ICD-10-CM

## 2019-07-30 DIAGNOSIS — M79622 Pain in left upper arm: Secondary | ICD-10-CM

## 2019-07-30 DIAGNOSIS — I890 Lymphedema, not elsewhere classified: Secondary | ICD-10-CM

## 2019-07-30 DIAGNOSIS — R600 Localized edema: Secondary | ICD-10-CM

## 2019-07-30 DIAGNOSIS — M25612 Stiffness of left shoulder, not elsewhere classified: Secondary | ICD-10-CM

## 2019-07-30 NOTE — Therapy (Signed)
[place], [place], [address] Phone: [phone]   Fax:  [phone]  Physical Therapy Discharge Note  Patient Details  Name: Judith Dixon MRN: [id number] Date of Birth: Jan 29, 1961 Referring Provider (PT): Ave Filter Date: 07/30/2019   PT End of Session - 07/30/19 0908    Visit Number 6    Number of Visits 6    Date for PT Re-Evaluation 08/13/19    PT Start Time 0907    PT Stop Time 1000    PT Time Calculation (min) 53 min    Activity Tolerance Patient tolerated treatment well    Behavior During Therapy [facility] for tasks assessed/performed           Past Medical History:  Diagnosis Date  . Allergy   . Breast cancer [facility])    left breast cancer diagnosed 01-05-2019- surgery 02-10-2019  . Complication of anesthesia    hard time waking up  . Family history of breast cancer   . Family history of colon cancer   . Family history of prostate cancer   . Motion sickness     Past Surgical History:  Procedure Laterality Date  . BREAST LUMPECTOMY WITH RADIOACTIVE SEED AND SENTINEL LYMPH NODE BIOPSY Left 02/10/2019   Procedure: LEFT BREAST LUMPECTOMY WITH RADIOACTIVE SEED AND LEFT AXILLARY SENTINEL LYMPH NODE BIOPSY;  Surgeon: Rolm Bookbinder, MD;  Location: [place];  Service: General;  Laterality: Left;  . ENDOMETRIAL ABLATION    . WISDOM TOOTH EXTRACTION     age [age]  . WRIST SURGERY      There were no vitals filed for this visit.   Subjective Assessment - 07/30/19 0908    Subjective Pt reports that she hasn't been wearing her sleeve for the past week. She had family in town and was constantly moving getting in/out of the pool and doing things. She states that every once in a while she will get little pains in the upper arm or breast that come and go very quickly.    Pertinent History L breast cancer s/p L lumpectomy and SLNB (4 nodes all negative) on 02/10/19, she has  finished radiation    Patient Stated Goals I want to understand lymphedema and to be able to treat it myself.    Currently in Pain? No/denies    Pain Score 0-No pain                 LYMPHEDEMA/ONCOLOGY QUESTIONNAIRE - 07/30/19 0001      Left Upper Extremity Lymphedema   15 cm Proximal to Olecranon Process 28.4 cm    10 cm Proximal to Olecranon Process 28 cm    Olecranon Process 23.5 cm    15 cm Proximal to Ulnar Styloid Process 23.8 cm    10 cm Proximal to Ulnar Styloid Process 20.9 cm    Just Proximal to Ulnar Styloid Process 15 cm    Across Hand at [employer] 17.5 cm    At [place] of 2nd Digit 5.7 cm                [facility]patient [facility]09/[facility]  Lymphedema Life Impact Scale Total Score 17.65 %            [facility] Adult PT Treatment/Exercise - 07/30/19 0001      Manual Therapy   Manual Therapy Manual Lymphatic Drainage (MLD);Soft tissue mobilization;Myofascial release;Passive ROM    Soft tissue mobilization Petrissage  over cording in the L axilla with P/ROM into abduction multiple repetitions.     Myofascial Release Easy myofascial release along the L axilla, L lateral/anterior breast and into the Medial L brachium cross hand with minimal stretch .    Manual Lymphatic Drainage (MLD) In supine: short neck, swimming in the terminus, bil axillary and L inguinal nodes, anterior inter-axillary anastomosis, L axillo-inguinal anastomosis, superior/inferior breast working toward corresponding anastomosis, re-worked anasotmosis spending extra time in this area, lateral L brachium, medial to lateral L brachium, lateral L brachium, L shoulder, re-worked anastomosis then deep abdominals.     Passive ROM P/ROM 5x flexion/abduction and external rotation with good end range stretch. Intermittent myofascial release and STM.                   PT Education - 07/30/19 1107    Education Details Pt will continue with home  management utlizing compression, MLD, exercise and stretching as needed.    Person(s) Educated Patient    Methods Explanation    Comprehension Verbalized understanding               PT Long Term Goals - 07/30/19 0914      PT LONG TERM GOAL #2   Title Pt will have compression bra/sleeve that fits appropriately to help her manage edema at home.    Baseline pt has both compression bra and sleeve.    Status Achieved      PT LONG TERM GOAL #5   Title Pt will demonstrate 90%  decrease in fibrosis in area of hematoma to avoid needing the area drained.    Baseline The swelling in the L breast has improved significantly and the hematoma is now the size of a small almond.    Status Achieved                 Plan - 07/30/19 0908    Clinical Impression Statement Pt goals were checked and she has met her goals at this time. She has appropriate compression garments and is proficient in performing MLD. Pt LUE was tight initially with Abduction but with repetition and very light stretching/myofascial release and STM she was able to get almost full ROM comparable to the RUE. Discussed wtih patient the importance of continuing to stretch to maintain ROM of the L shoulder. Pt will be discharged today for home maintenance and is aware of reasons to return to physical therapy including increased swelling or loss of ROM she is unable to manage at home.    Examination-Activity Limitations Lift;Reach Overhead;Carry    Examination-Participation Restrictions Cleaning    Rehab Potential Excellent    PT Frequency Biweekly    PT Duration 6 weeks    PT Treatment/Interventions Therapeutic exercise;Neuromuscular re-education;Therapeutic activities;Patient/family education;Manual techniques    PT Next Visit Plan pt will be discharged today    PT Home Exercise Plan self MLD for the LUE    Consulted and Agree with Plan of Care Patient           Patient will benefit from skilled therapeutic intervention  in order to improve the following deficits and impairments:  Increased edema, Pain  Visit Diagnosis: Lymphedema, not elsewhere classified  Pain in left upper arm  Localized edema  Stiffness of left shoulder, not elsewhere classified  Muscle weakness (generalized)     Problem List Patient Active Problem List   Diagnosis Date Noted  . Genetic testing 01/27/2019  . Family history of breast cancer   .  Family history of prostate cancer   . Family history of colon cancer   . Malignant neoplasm of upper-outer quadrant of left breast in female, estrogen receptor positive ([place]) 01/13/2019  . Trigger finger of left thumb 03/03/2018  . Trigger middle finger of left hand 03/03/2018   PHYSICAL THERAPY DISCHARGE SUMMARY  Plan: Patient agrees to discharge.  Patient goals were met. Patient is being discharged due to meeting the stated rehab goals.  ?????       Ander Purpura, PT 07/30/2019, 11:10 AM  [place], [place], [address] Phone: [phone]   Fax:  [phone]  Name: Judith Dixon MRN: [id number] Date of Birth: October 12, 1960

## 2019-08-10 ENCOUNTER — Other Ambulatory Visit: Payer: Self-pay

## 2019-08-10 ENCOUNTER — Inpatient Hospital Stay: Payer: BC Managed Care – PPO | Attending: Hematology and Oncology

## 2019-08-10 ENCOUNTER — Encounter: Payer: Self-pay | Admitting: Adult Health

## 2019-08-10 DIAGNOSIS — Z79811 Long term (current) use of aromatase inhibitors: Secondary | ICD-10-CM | POA: Diagnosis not present

## 2019-08-10 DIAGNOSIS — Z170 Estrogen receptor positive status [ER+]: Secondary | ICD-10-CM

## 2019-08-10 DIAGNOSIS — C50412 Malignant neoplasm of upper-outer quadrant of left female breast: Secondary | ICD-10-CM | POA: Diagnosis present

## 2019-08-10 DIAGNOSIS — R4181 Age-related cognitive decline: Secondary | ICD-10-CM | POA: Diagnosis not present

## 2019-08-10 DIAGNOSIS — M8580 Other specified disorders of bone density and structure, unspecified site: Secondary | ICD-10-CM | POA: Insufficient documentation

## 2019-08-10 DIAGNOSIS — N951 Menopausal and female climacteric states: Secondary | ICD-10-CM | POA: Diagnosis not present

## 2019-08-10 LAB — CMP (CANCER CENTER ONLY)
ALT: 19 U/L (ref 0–44)
AST: 19 U/L (ref 15–41)
Albumin: 4 g/dL (ref 3.5–5.0)
Alkaline Phosphatase: 78 U/L (ref 38–126)
Anion gap: 8 (ref 5–15)
BUN: 15 mg/dL (ref 6–20)
CO2: 26 mmol/L (ref 22–32)
Calcium: 9.9 mg/dL (ref 8.9–10.3)
Chloride: 107 mmol/L (ref 98–111)
Creatinine: 0.77 mg/dL (ref 0.44–1.00)
GFR, Est AFR Am: >60 mL/min (ref >60)
GFR, Estimated: >60 mL/min (ref >60)
Glucose, Bld: 84 mg/dL (ref 70–99)
Potassium: 4.1 mmol/L (ref 3.5–5.1)
Sodium: 141 mmol/L (ref 135–145)
Total Bilirubin: 0.8 mg/dL (ref 0.3–1.2)
Total Protein: 7.4 g/dL (ref 6.5–8.1)

## 2019-08-10 LAB — CBC WITH DIFFERENTIAL (CANCER CENTER ONLY)
Abs Immature Granulocytes: 0.01 10*3/uL (ref 0.00–0.07)
Basophils Absolute: 0.1 10*3/uL (ref 0.0–0.1)
Basophils Relative: 1 %
Eosinophils Absolute: 0.1 10*3/uL (ref 0.0–0.5)
Eosinophils Relative: 1 %
HCT: 37.9 % (ref 36.0–46.0)
Hemoglobin: 12.6 g/dL (ref 12.0–15.0)
Immature Granulocytes: 0 %
Lymphocytes Relative: 25 %
Lymphs Abs: 1.5 10*3/uL (ref 0.7–4.0)
MCH: 31.1 pg (ref 26.0–34.0)
MCHC: 33.2 g/dL (ref 30.0–36.0)
MCV: 93.6 fL (ref 80.0–100.0)
Monocytes Absolute: 0.5 10*3/uL (ref 0.1–1.0)
Monocytes Relative: 9 %
Neutro Abs: 3.8 10*3/uL (ref 1.7–7.7)
Neutrophils Relative %: 64 %
Platelet Count: 184 10*3/uL (ref 150–400)
RBC: 4.05 MIL/uL (ref 3.87–5.11)
RDW: 12.2 % (ref 11.5–15.5)
WBC Count: 5.9 10*3/uL (ref 4.0–10.5)
nRBC: 0 % (ref 0.0–0.2)

## 2019-08-10 LAB — VITAMIN D 25 HYDROXY (VIT D DEFICIENCY, FRACTURES): Vit D, 25-Hydroxy: 51.96 ng/mL (ref 30–100)

## 2019-08-11 ENCOUNTER — Telehealth: Payer: Self-pay

## 2019-08-11 NOTE — Telephone Encounter (Signed)
PROGRESS NOTE: Per patient request sent schedule message for the appointment she scheduled with Dr. [name] on Monday November 29th that she needs to be rescheduled for a Tuesday or Thursday. Let schedulers know to please call patient and let her know new date and time of November appointment. Sent!

## 2019-08-13 ENCOUNTER — Other Ambulatory Visit: Payer: BC Managed Care – PPO

## 2019-09-17 ENCOUNTER — Encounter (INDEPENDENT_AMBULATORY_CARE_PROVIDER_SITE_OTHER): Payer: Self-pay

## 2019-09-24 ENCOUNTER — Encounter (INDEPENDENT_AMBULATORY_CARE_PROVIDER_SITE_OTHER): Payer: Self-pay

## 2019-10-04 ENCOUNTER — Other Ambulatory Visit: Payer: Self-pay | Admitting: Hematology and Oncology

## 2019-11-09 ENCOUNTER — Telehealth: Payer: Self-pay | Admitting: Hematology and Oncology

## 2019-11-09 NOTE — Telephone Encounter (Signed)
Scheduling Message Entered by Bary Castilla C on 11/05/2019 at 1:33 PM Priority: Low EST PT 15  Department: [facility]  Provider: [name], MD  Appointment Notes:  please schedule with Dr. [name] the week of 10/11. thanks. please call pt with appt     Called patient per 10/1 schedule message - patient unable to schedule at the moment but will call back to schedule.

## 2019-12-05 IMAGING — CR DG HAND COMPLETE 3+V*L*
3 series · 3 of 3 positions shown · non-contrast
Comparison: None.

CLINICAL DATA: Bilateral hand pain stiffness 6 weeks. No trauma.
Left greater than right.

EXAM:
LEFT HAND - COMPLETE 3+ VIEW

[x hand pa left]
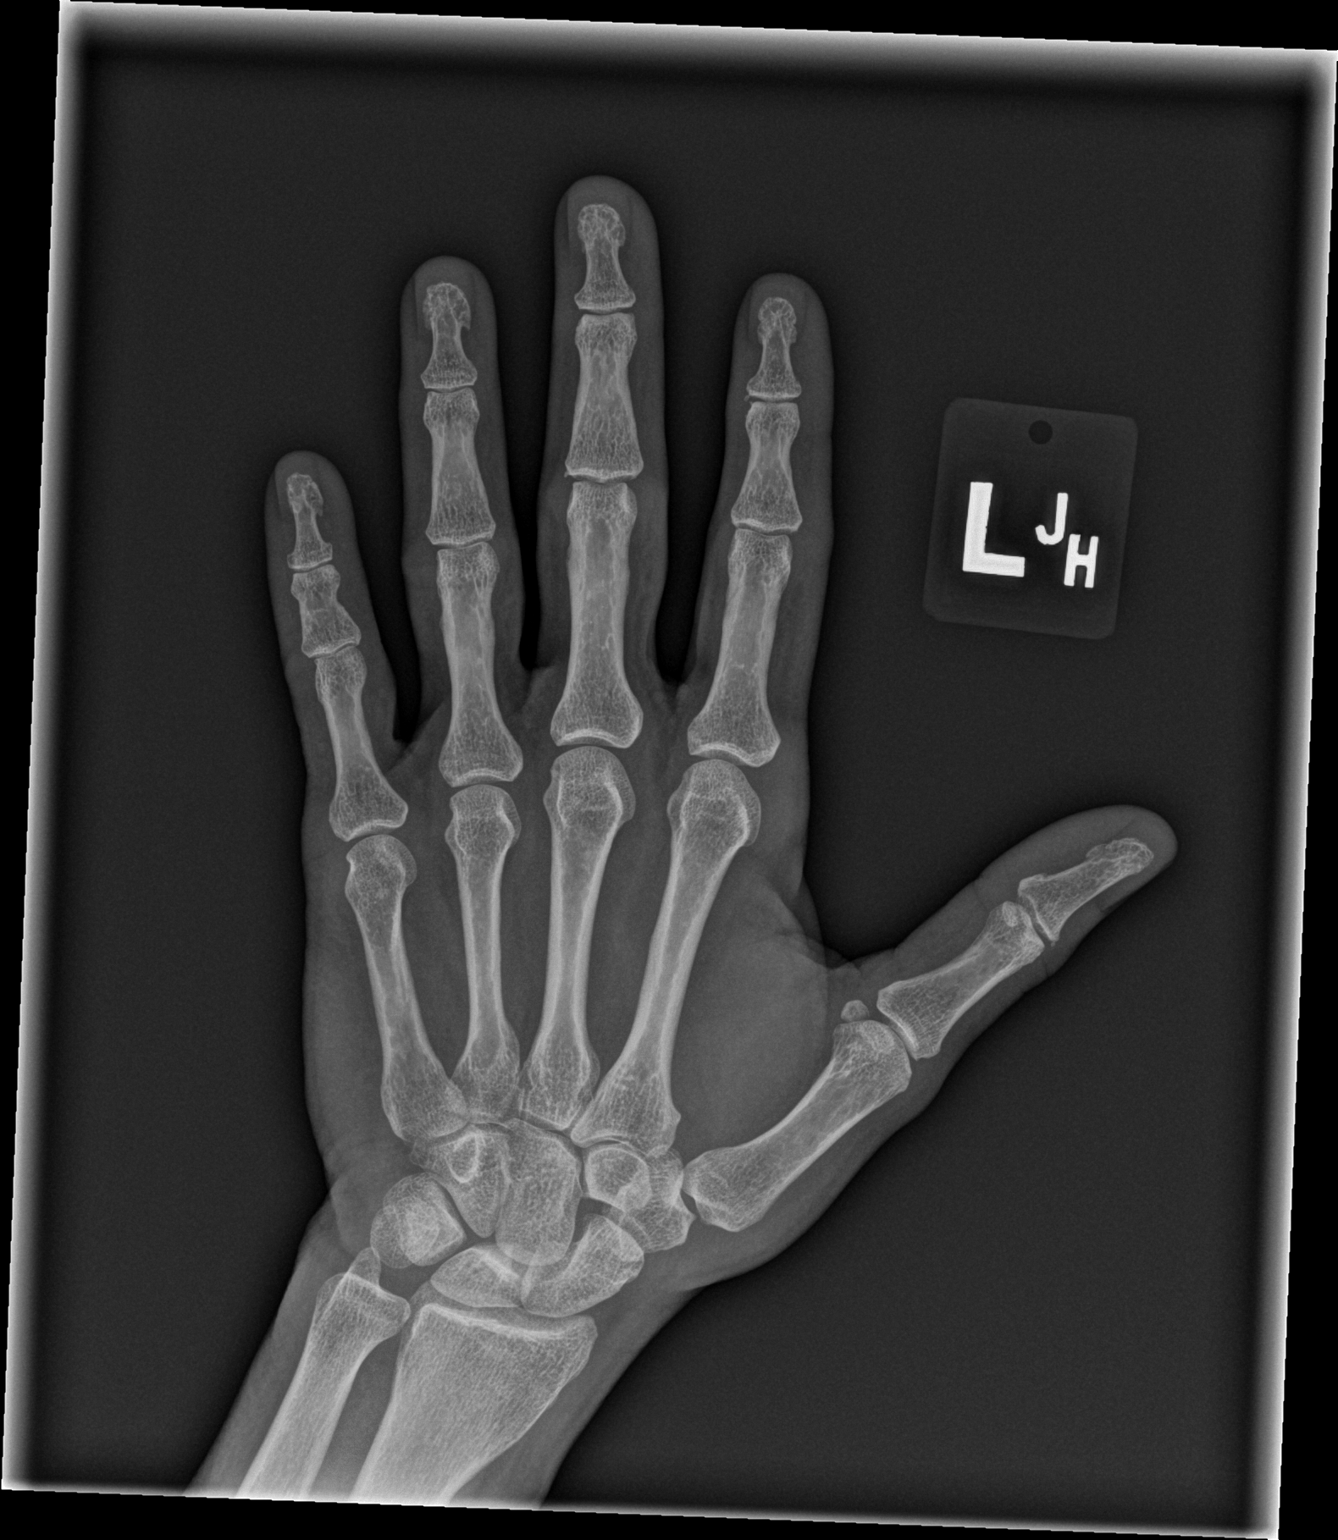

[x hand obl left]
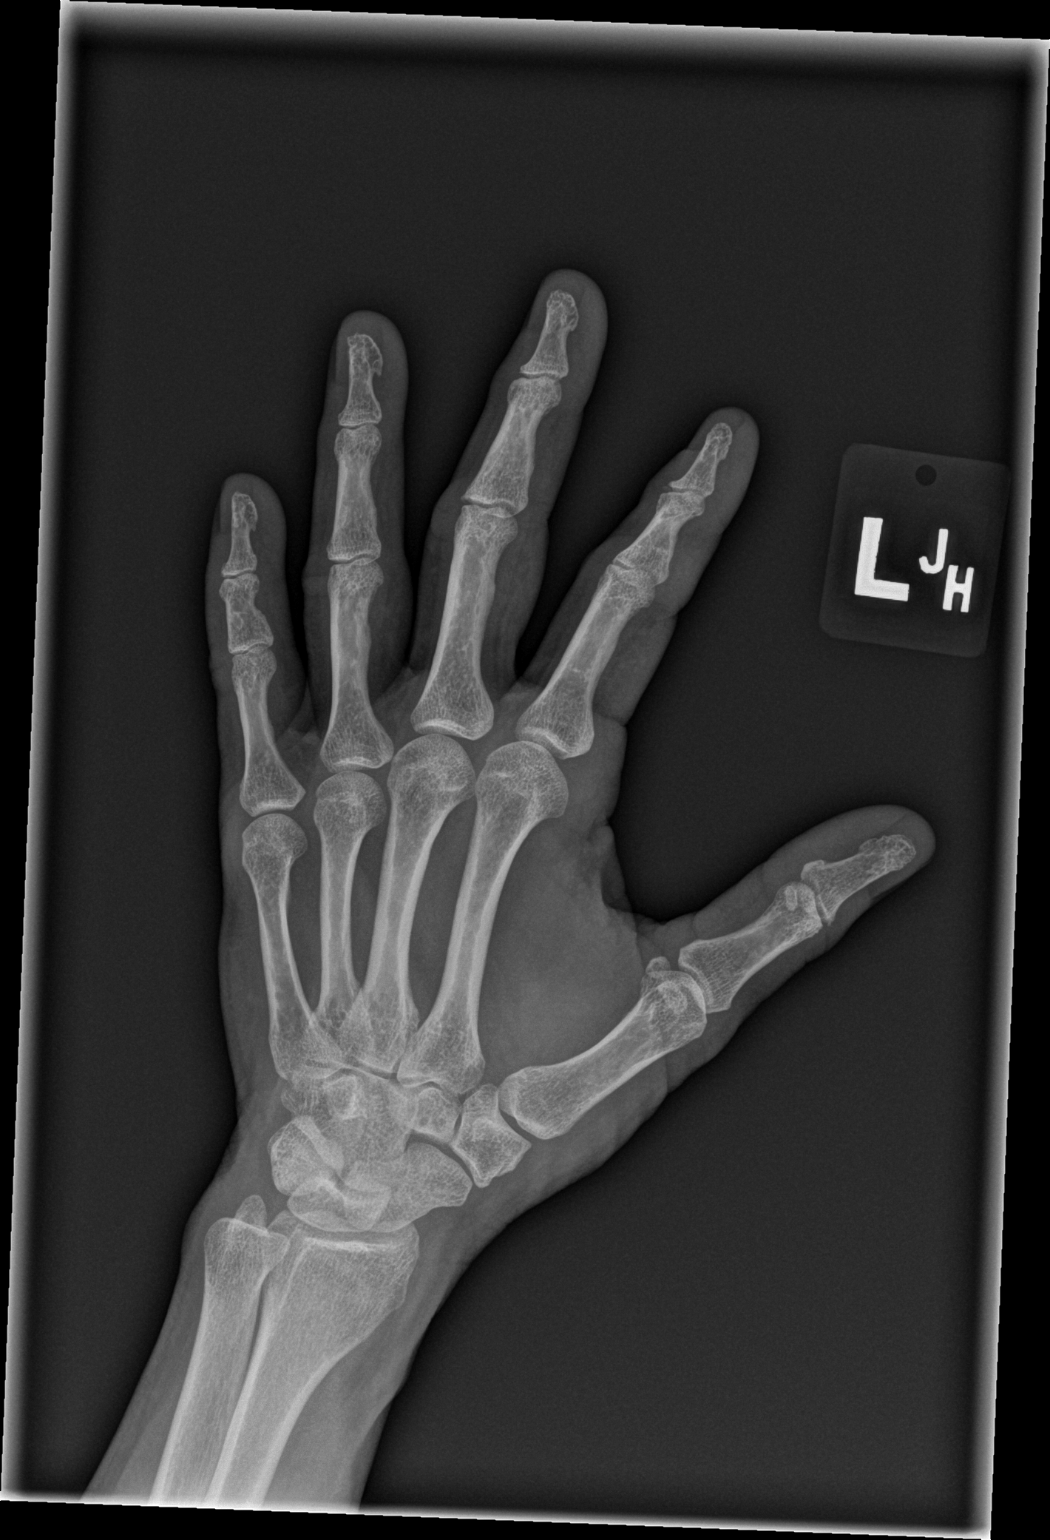

[x hand lat left]
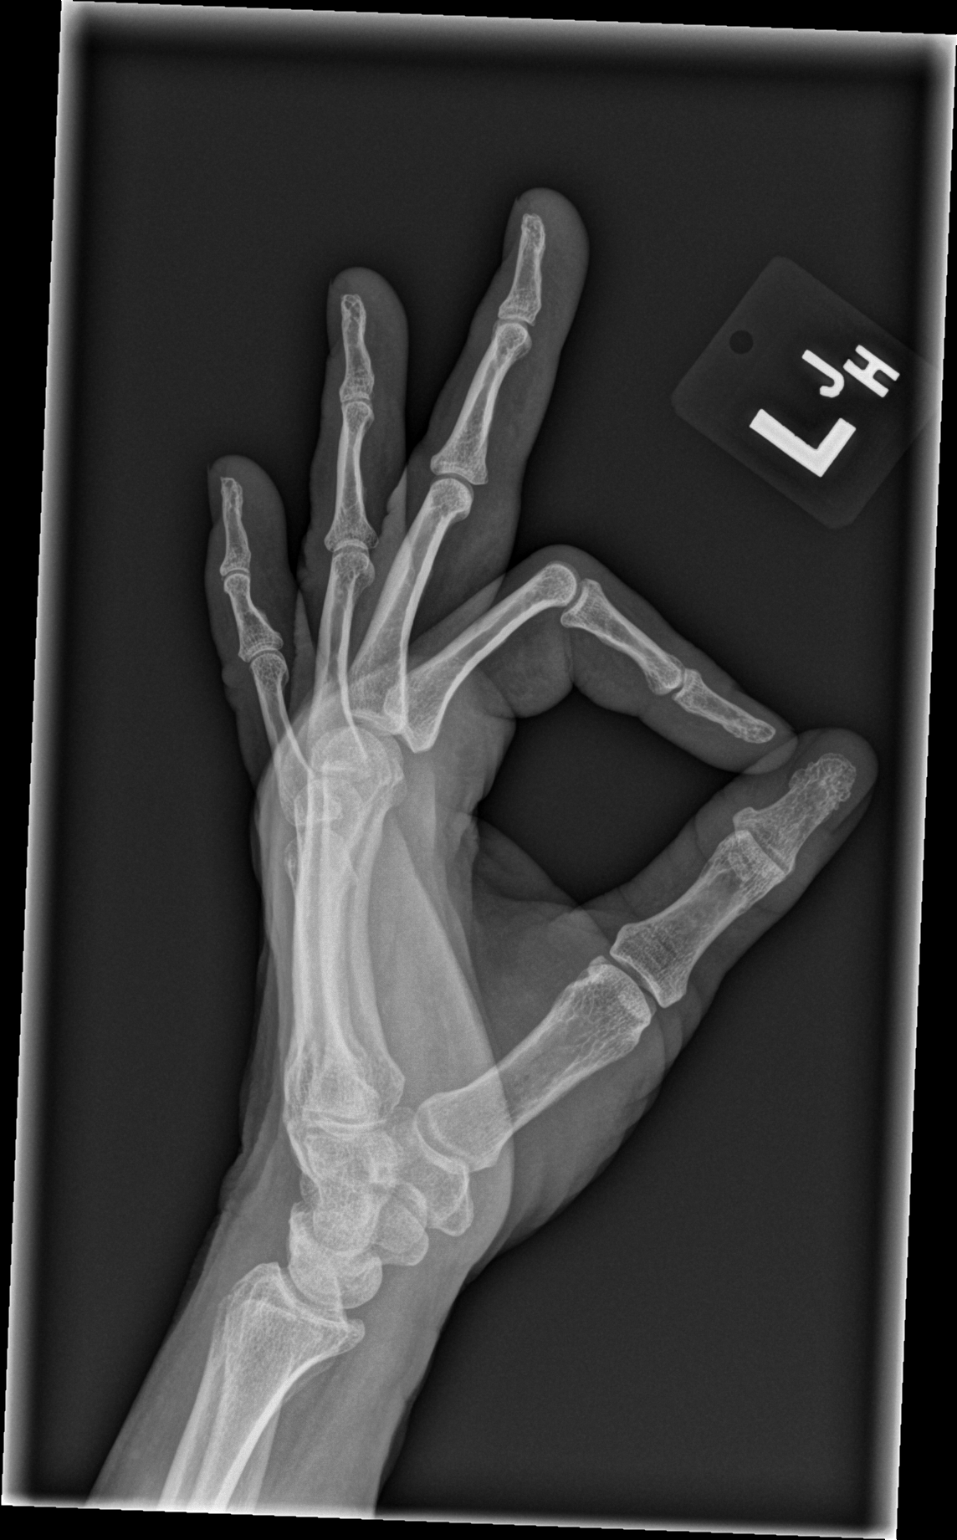

[3 of 3 positions shown; findings below may reference images not displayed]

FINDINGS: There is no evidence of fracture or dislocation. There is no
evidence of arthropathy or other focal bone abnormality. Soft
tissues are unremarkable.
IMPRESSION: Negative.

## 2019-12-06 ENCOUNTER — Telehealth: Payer: Self-pay | Admitting: Hematology and Oncology

## 2019-12-06 NOTE — Telephone Encounter (Signed)
Called pt per 10/29 sch message - no answer . Left message for patient to call back and reschedule appt.

## 2019-12-20 NOTE — Progress Notes (Signed)
Patient Care Team: Judith Huxley, PA as PCP - General (Family Medicine) Rolm Bookbinder, MD as Consulting Physician (General Surgery) [name], MD as Consulting Physician (Hematology and Oncology) Gery Pray, MD as Consulting Physician (Radiation Oncology)  DIAGNOSIS:    ICD-10-CM   1. Malignant neoplasm of upper-outer quadrant of left breast in female, estrogen receptor positive ([place])  C50.412    Z17.0     SUMMARY OF ONCOLOGIC HISTORY: Oncology History  Malignant neoplasm of upper-outer quadrant of left breast in female, estrogen receptor positive ([place])  01/05/2019 Cancer Staging   Staging form: Breast, AJCC 8th Edition - Clinical stage from 01/05/2019: Stage IA (cT1b, cN0, cM0, G2, ER+, PR+, HER2-)   01/05/2019 Initial Diagnosis   Routine screening mammogram detected a 0.6cm architectural distortion in the left breast. Biopsy showed IDC with DCIS, grade 2, HER-2 - by FISH, ER+ 90%, PR+ 85%, Ki67 15%.   01/27/2019 Genetic Testing   Negative genetic testing. No pathogenic variants identified. VUS in CHEK2 called c.8G>A identified on the [facility] Breast Cancer STAT Panel + Common Hereditary Cancers Panel. The STAT Breast cancer panel offered by [facility] includes sequencing and rearrangement analysis for the following 9 genes:  ATM, BRCA1, BRCA2, CDH1, CHEK2, PALB2, PTEN, STK11 and TP53.  The Common Hereditary Cancers Panel offered by [facility] includes sequencing and/or deletion duplication testing of the following 48 genes: APC, ATM, AXIN2, BARD1, BMPR1A, BRCA1, BRCA2, BRIP1, CDH1, CDKN2A (p14ARF), CDKN2A (p16INK4a), CKD4, CHEK2, CTNNA1, DICER1, EPCAM (Deletion/duplication testing only), GREM1 (promoter region deletion/duplication testing only), KIT, MEN1, MLH1, MSH2, MSH3, MSH6, MUTYH, NBN, NF1, NHTL1, PALB2, PDGFRA, PMS2, POLD1, POLE, PTEN, RAD50, RAD51C, RAD51D, RNF43, SDHB, SDHC, SDHD, SMAD4, SMARCA4. STK11, TP53, TSC1, TSC2, and VHL.  The following genes were evaluated for  sequence changes only: SDHA and HOXB13 c.251G>A variant only. The report date is 01/26/2019.    02/10/2019 Cancer Staging   Staging form: Breast, AJCC 8th Edition - Pathologic stage from 02/10/2019: Stage IA (pT1b, pN0, cM0, G2, ER+, PR+, HER2-) - Signed by [name], NP on 02/24/2019   02/10/2019 Surgery   Left lumpectomy [name]) [phone]): IDC, grade 2, 0.6cm, with intermediate grade DCIS, clear margins, 4 left axillary lymph nodes negative    03/02/2019 Oncotype testing   The Oncotype DX score was 13 predicting a risk of outside the breast recurrence over the next 9 years of 4% if the patient's only systemic therapy is tamoxifen for 5 years.    04/06/2019 - 05/03/2019 Radiation Therapy   The patient initially received a dose of 40.05 Gy in 15 fractions to the breast using whole-breast tangent fields. This was delivered using a 3-D conformal technique. The pt received a boost delivering an additional 12 Gy in 6 fractions using a electron boost with 11mV electrons. The total dose was 52.05 Gy.    05/24/2019 -  Anti-estrogen oral therapy   Anastrozole could not tolerate it because of severe hot flashes and joint stiffness at even half the dose, switched to letrozole (she will start at 1/4 tablet daily) on 12/21/2019     CHIEF COMPLIANT: Follow-up of left breast cancer , could not tolerate anastrozole  INTERVAL HISTORY: Judith Leeveris a 59y.o. with above-mentioned history of left breast cancer who underwent a lumpectomy, radiation, and could not tolerate anastrozole.Mammogram on 11/16/19 showed no evidence of malignancy bilaterally. She presentsto the clinic today for follow-up.  She stopped anastrozole in September and the symptoms of severe hot flashes and joint stiffness have all improved.  She is here to discuss if any alternative treatments are available.  She tells me that she is very sensitive to medications and even small doses give her significant side  effects.  ALLERGIES:  has no active allergies.  MEDICATIONS:  Current Outpatient Medications  Medication Sig Dispense Refill  . Biotin 1 MG CAPS Take by mouth.    . Cholecalciferol (VITAMIN D3) 50 MCG (2000 UT) capsule Take 2,000 Units by mouth daily.    Marland Kitchen EPINEPHrine 0.3 mg/0.3 mL IJ SOAJ injection USE AS DIRECTED FOR SEVERE ALLERGIC REACTIONS    . letrozole (FEMARA) 2.5 MG tablet Take 1 tablet (2.5 mg total) by mouth daily. 30 tablet 0  . Multiple Vitamin (MULTIVITAMIN) tablet Take 1 tablet by mouth daily.    Marland Kitchen OVER THE COUNTER MEDICATION Natural vitamin for hair growth     No current facility-administered medications for this visit.    PHYSICAL EXAMINATION: ECOG PERFORMANCE STATUS: 1 - Symptomatic but completely ambulatory  Vitals:   12/21/19 1526  BP: 134/85  Pulse: 76  Resp: 17  Temp: 97.9 F (36.6 C)  SpO2: 97%   Filed Weights   12/21/19 1526  Weight: 143 lb 4.8 oz (65 kg)     LABORATORY DATA:  I have reviewed the data as listed CMP Latest Ref Rng & Units 08/10/2019 04/30/2019  Glucose 70 - 99 mg/dL 84 92  BUN 6 - 20 mg/dL 15 16  Creatinine 0.44 - 1.00 mg/dL 0.77 0.85  Sodium 135 - 145 mmol/L 141 142  Potassium 3.5 - 5.1 mmol/L 4.1 4.0  Chloride 98 - 111 mmol/L 107 104  CO2 22 - 32 mmol/L 26 29  Calcium 8.9 - 10.3 mg/dL 9.9 9.4  Total Protein 6.5 - 8.1 g/dL 7.4 7.2  Total Bilirubin 0.3 - 1.2 mg/dL 0.8 0.8  Alkaline Phos 38 - 126 U/L 78 78  AST 15 - 41 U/L 19 18  ALT 0 - 44 U/L 19 18    Lab Results  Component Value Date   WBC 5.9 08/10/2019   HGB 12.6 08/10/2019   HCT 37.9 08/10/2019   MCV 93.6 08/10/2019   PLT 184 08/10/2019   NEUTROABS 3.8 08/10/2019    ASSESSMENT & PLAN:  Malignant neoplasm of upper-outer quadrant of left breast in female, estrogen receptor positive ([place]) 02/10/2019:Left lumpectomy [name]): IDC, grade 2, 0.6cm, with intermediate grade DCIS, clear margins, 4 left axillary lymph nodes negative T1BN0 stage Ia Oncotype DX score 13:  Low risk: 4%Risk of distant recurrence  Oncotype discussion: I discussed a low risk Oncotype score and that there is no benefit to systemic chemotherapy.  Treatment plan: 1.  Adjuvant radiation 2.  Follow-up adjuvant antiestrogen therapy with anastrozole 1 mg daily x5 years started 05/24/2019 discontinued September 2021.  She could not tolerate half a dose of anastrozole.  Switched to letrozole 1/4 tablet daily 12/21/2019 --------------------------------------------------------------------------------------- Patient is very reluctant to consider antiestrogen therapy because of the side effects.  She is very worried about high cholesterol Alzheimer's etc. Anastrozole toxicities:  1. Profound hot flashes in spite of taking gabapentin 2. Difficulty with sleeping 3. Diffuse arthralgias and myalgias  Because of severe adverse effects to anastrozole therapy, we discussed different options and determined that she wants to try letrozole at one fourth of the doses.  I sent her prescription for 30 tablets.  I will connect with her in 4 weeks to assess tolerance to letrozole therapy.   No orders of the defined types were placed in this encounter.  The  patient has a good understanding of the overall plan. she agrees with it. she will call with any problems that may develop before the next visit here.  Total time spent: 30 mins including face to face time and time spent for planning, charting and coordination of care  [name], MD 12/21/2019  I, Cloyde Reams Dorshimer, am acting as scribe for Dr. [name].  I have reviewed the above documentation for accuracy and completeness, and I agree with the above.

## 2019-12-21 ENCOUNTER — Other Ambulatory Visit: Payer: Self-pay

## 2019-12-21 ENCOUNTER — Inpatient Hospital Stay: Payer: BC Managed Care – PPO | Attending: Hematology and Oncology | Admitting: Hematology and Oncology

## 2019-12-21 DIAGNOSIS — Z923 Personal history of irradiation: Secondary | ICD-10-CM | POA: Diagnosis not present

## 2019-12-21 DIAGNOSIS — Z79899 Other long term (current) drug therapy: Secondary | ICD-10-CM | POA: Insufficient documentation

## 2019-12-21 DIAGNOSIS — G4700 Insomnia, unspecified: Secondary | ICD-10-CM | POA: Diagnosis not present

## 2019-12-21 DIAGNOSIS — Z79811 Long term (current) use of aromatase inhibitors: Secondary | ICD-10-CM | POA: Insufficient documentation

## 2019-12-21 DIAGNOSIS — M797 Fibromyalgia: Secondary | ICD-10-CM | POA: Diagnosis not present

## 2019-12-21 DIAGNOSIS — Z170 Estrogen receptor positive status [ER+]: Secondary | ICD-10-CM | POA: Insufficient documentation

## 2019-12-21 DIAGNOSIS — R232 Flushing: Secondary | ICD-10-CM | POA: Insufficient documentation

## 2019-12-21 DIAGNOSIS — C50412 Malignant neoplasm of upper-outer quadrant of left female breast: Secondary | ICD-10-CM | POA: Diagnosis present

## 2019-12-21 MED ORDER — LETROZOLE 2.5 MG PO TABS: 2.5000 mg | ORAL_TABLET | Freq: Every day | ORAL | 0 refills | Status: DC

## 2019-12-21 NOTE — Assessment & Plan Note (Signed)
02/10/2019:Left lumpectomy [name]): IDC, grade 2, 0.6cm, with intermediate grade DCIS, clear margins, 4 left axillary lymph nodes negative T1BN0 stage Ia Oncotype DX score 13: Low risk: 4%Risk of distant recurrence  Oncotype discussion: I discussed a low risk Oncotype score and that there is no benefit to systemic chemotherapy.  Treatment plan: 1.  Adjuvant radiation 2.  Follow-up adjuvant antiestrogen therapy with anastrozole 1 mg daily x5 years started 05/24/2019 discontinued September 2021.  She could not tolerate half a dose of anastrozole.  Switched to letrozole 1/4 tablet daily 12/21/2019 --------------------------------------------------------------------------------------- Patient is very reluctant to consider antiestrogen therapy because of the side effects.  She is very worried about high cholesterol Alzheimer's etc.  Because of severe adverse effects to anastrozole therapy, we discussed different options and determined that she wants to try letrozole at one fourth of the doses.  I sent her prescription for 30 tablets.  I will connect with her in 4 weeks to assess tolerance to letrozole therapy.

## 2019-12-24 ENCOUNTER — Encounter (INDEPENDENT_AMBULATORY_CARE_PROVIDER_SITE_OTHER): Payer: Self-pay

## 2019-12-31 ENCOUNTER — Encounter (INDEPENDENT_AMBULATORY_CARE_PROVIDER_SITE_OTHER): Payer: Self-pay

## 2020-01-03 ENCOUNTER — Ambulatory Visit: Payer: BC Managed Care – PPO | Admitting: Hematology and Oncology

## 2020-01-04 ENCOUNTER — Ambulatory Visit: Payer: BC Managed Care – PPO | Admitting: Hematology and Oncology

## 2020-01-07 ENCOUNTER — Encounter (INDEPENDENT_AMBULATORY_CARE_PROVIDER_SITE_OTHER): Payer: Self-pay

## 2020-01-16 ENCOUNTER — Other Ambulatory Visit: Payer: Self-pay | Admitting: Hematology and Oncology

## 2020-01-17 NOTE — Progress Notes (Signed)
HEMATOLOGY-ONCOLOGY TELEPHONE VISIT PROGRESS NOTE  I connected with Judith Dixon on 01/18/2020 at  9:45 AM EST by telephone and verified that I am speaking with the correct person using two identifiers.  I discussed the limitations, risks, security and privacy concerns of performing an evaluation and management service by telephone and the availability of in person appointments.  I also discussed with the patient that there may be a patient responsible charge related to this service. The patient expressed understanding and agreed to proceed.   History of Present Illness: Judith Dixon is a 59 y.o. female with above-mentioned history of left breast cancerwhounderwent a lumpectomy, radiation, and is currently on letrozole, as she could not tolerate anastrozole. She presentsover the phonetoday for follow-up.  Oncology History  Malignant neoplasm of upper-outer quadrant of left breast in female, estrogen receptor positive ([place])  01/05/2019 Cancer Staging   Staging form: Breast, AJCC 8th Edition - Clinical stage from 01/05/2019: Stage IA (cT1b, cN0, cM0, G2, ER+, PR+, HER2-)   01/05/2019 Initial Diagnosis   Routine screening mammogram detected a 0.6cm architectural distortion in the left breast. Biopsy showed IDC with DCIS, grade 2, HER-2 - by FISH, ER+ 90%, PR+ 85%, Ki67 15%.   01/27/2019 Genetic Testing   Negative genetic testing. No pathogenic variants identified. VUS in CHEK2 called c.8G>A identified on the [facility] Breast Cancer STAT Panel + Common Hereditary Cancers Panel. The STAT Breast cancer panel offered by [facility] includes sequencing and rearrangement analysis for the following 9 genes:  ATM, BRCA1, BRCA2, CDH1, CHEK2, PALB2, PTEN, STK11 and TP53.  The Common Hereditary Cancers Panel offered by [facility] includes sequencing and/or deletion duplication testing of the following 48 genes: APC, ATM, AXIN2, BARD1, BMPR1A, BRCA1, BRCA2, BRIP1, CDH1, CDKN2A (p14ARF), CDKN2A (p16INK4a), CKD4,  CHEK2, CTNNA1, DICER1, EPCAM (Deletion/duplication testing only), GREM1 (promoter region deletion/duplication testing only), KIT, MEN1, MLH1, MSH2, MSH3, MSH6, MUTYH, NBN, NF1, NHTL1, PALB2, PDGFRA, PMS2, POLD1, POLE, PTEN, RAD50, RAD51C, RAD51D, RNF43, SDHB, SDHC, SDHD, SMAD4, SMARCA4. STK11, TP53, TSC1, TSC2, and VHL.  The following genes were evaluated for sequence changes only: SDHA and HOXB13 c.251G>A variant only. The report date is 01/26/2019.    02/10/2019 Cancer Staging   Staging form: Breast, AJCC 8th Edition - Pathologic stage from 02/10/2019: Stage IA (pT1b, pN0, cM0, G2, ER+, PR+, HER2-) - Signed by [name], NP on 02/24/2019   02/10/2019 Surgery   Left lumpectomy [name]) [phone]): IDC, grade 2, 0.6cm, with intermediate grade DCIS, clear margins, 4 left axillary lymph nodes negative    03/02/2019 Oncotype testing   The Oncotype DX score was 13 predicting a risk of outside the breast recurrence over the next 9 years of 4% if the patient's only systemic therapy is tamoxifen for 5 years.    04/06/2019 - 05/03/2019 Radiation Therapy   The patient initially received a dose of 40.05 Gy in 15 fractions to the breast using whole-breast tangent fields. This was delivered using a 3-D conformal technique. The pt received a boost delivering an additional 12 Gy in 6 fractions using a electron boost with 54mV electrons. The total dose was 52.05 Gy.    05/24/2019 -  Anti-estrogen oral therapy   Anastrozole could not tolerate it because of severe hot flashes and joint stiffness at even half the dose, switched to letrozole (she will start at 1/4 tablet daily) on 12/21/2019     Observations/Objective:  Hot flashes manageable   Assessment Plan:  Malignant neoplasm of upper-outer quadrant of left breast in female, estrogen receptor  positive ([place]) 02/10/2019:Left lumpectomy [name]): IDC, grade 2, 0.6cm, with intermediate grade DCIS, clear margins, 4 left axillary lymph nodes  negativeT1BN0 stage Ia Oncotype DX score 13: Low risk: 4%Risk of distant recurrence  Oncotype discussion: I discussed a low risk Oncotype score and that there is no benefit to systemic chemotherapy.  Treatment plan: 1.Adjuvant radiation 2.Follow-up adjuvant antiestrogen therapy with anastrozole 1 mg daily x5 years started 05/24/2019 discontinued September 2021.  She could not tolerate half a dose of anastrozole.  Switched to letrozole 1/4 tablet daily 12/21/2019 --------------------------------------------------------------------------------------- Patient is very reluctant to consider antiestrogen therapy because of the side effects.  She is very worried about high cholesterol Alzheimer's etc.  Letrozole toxicities:  1. Hot flashes: Manageable 2. Sleep: hot flash at 2 AM.    RTC in 1 year for follow up    I discussed the assessment and treatment plan with the patient. The patient was provided an opportunity to ask questions and all were answered. The patient agreed with the plan and demonstrated an understanding of the instructions. The patient was advised to call back or seek an in-person evaluation if the symptoms worsen or if the condition fails to improve as anticipated.   I provided 11 minutes of non-face-to-face time during this encounter.   Rulon Eisenmenger, MD 01/18/2020    I, Molly Dorshimer, am acting as scribe for [name], MD.  I have reviewed the above documentation for accuracy and completeness, and I agree with the above.

## 2020-01-18 ENCOUNTER — Inpatient Hospital Stay: Payer: BC Managed Care – PPO | Attending: Hematology and Oncology | Admitting: Hematology and Oncology

## 2020-01-18 DIAGNOSIS — C50412 Malignant neoplasm of upper-outer quadrant of left female breast: Secondary | ICD-10-CM

## 2020-01-18 DIAGNOSIS — Z170 Estrogen receptor positive status [ER+]: Secondary | ICD-10-CM

## 2020-01-18 NOTE — Assessment & Plan Note (Signed)
02/10/2019:Left lumpectomy [name]): IDC, grade 2, 0.6cm, with intermediate grade DCIS, clear margins, 4 left axillary lymph nodes negativeT1BN0 stage Ia Oncotype DX score 13: Low risk: 4%Risk of distant recurrence  Oncotype discussion: I discussed a low risk Oncotype score and that there is no benefit to systemic chemotherapy.  Treatment plan: 1.Adjuvant radiation 2.Follow-up adjuvant antiestrogen therapy with anastrozole 1 mg daily x5 years started 05/24/2019 discontinued September 2021.  She could not tolerate half a dose of anastrozole.  Switched to letrozole 1/4 tablet daily 12/21/2019 --------------------------------------------------------------------------------------- Patient is very reluctant to consider antiestrogen therapy because of the side effects.  She is very worried about high cholesterol Alzheimer's etc.  Letrozole toxicities:        I will connect with her in 4 weeks to assess tolerance to letrozole therapy.

## 2020-01-19 ENCOUNTER — Telehealth: Payer: Self-pay | Admitting: Hematology and Oncology

## 2020-01-19 NOTE — Telephone Encounter (Signed)
Scheduled per 12/14 los. Called pt and left a msg

## 2020-01-28 ENCOUNTER — Encounter (INDEPENDENT_AMBULATORY_CARE_PROVIDER_SITE_OTHER): Payer: Self-pay

## 2020-02-04 ENCOUNTER — Encounter (INDEPENDENT_AMBULATORY_CARE_PROVIDER_SITE_OTHER): Payer: Self-pay

## 2020-02-09 ENCOUNTER — Other Ambulatory Visit: Payer: Self-pay | Admitting: Hematology and Oncology

## 2020-02-09 ENCOUNTER — Telehealth: Payer: Self-pay | Admitting: Hematology and Oncology

## 2020-02-09 NOTE — Telephone Encounter (Signed)
Scheduled appt per 1/5 sch msg - unable to reach pt. Left message for pt with appt date and time

## 2020-02-13 NOTE — Progress Notes (Signed)
HEMATOLOGY-ONCOLOGY TELEPHONE VISIT PROGRESS NOTE  I connected with Judith Dixon on 02/14/2020 at 10:15 AM EST by telephone and verified that I am speaking with the correct person using two identifiers.  I discussed the limitations, risks, security and privacy concerns of performing an evaluation and management service by telephone and the availability of in person appointments.  I also discussed with the patient that there may be a patient responsible charge related to this service. The patient expressed understanding and agreed to proceed.   History of Present Illness: Judith Dixon is a 60 y.o. female with above-mentioned history of left breast cancerwhounderwent a lumpectomy,radiation, andis currently on letrozole, as she could not tolerateanastrozole.She presentsover the phonetodayfor follow-up.  Her major complaints are related to hot flashes which seem to wake her up at night and prevent her from sleeping well.  She has used a THC-based gummy vitamin which has helped her significantly.  Oncology History  Malignant neoplasm of upper-outer quadrant of left breast in female, estrogen receptor positive ([place])  01/05/2019 Cancer Staging   Staging form: Breast, AJCC 8th Edition - Clinical stage from 01/05/2019: Stage IA (cT1b, cN0, cM0, G2, ER+, PR+, HER2-)   01/05/2019 Initial Diagnosis   Routine screening mammogram detected a 0.6cm architectural distortion in the left breast. Biopsy showed IDC with DCIS, grade 2, HER-2 - by FISH, ER+ 90%, PR+ 85%, Ki67 15%.   01/27/2019 Genetic Testing   Negative genetic testing. No pathogenic variants identified. VUS in CHEK2 called c.8G>A identified on the [facility] Breast Cancer STAT Panel + Common Hereditary Cancers Panel. The STAT Breast cancer panel offered by [facility] includes sequencing and rearrangement analysis for the following 9 genes:  ATM, BRCA1, BRCA2, CDH1, CHEK2, PALB2, PTEN, STK11 and TP53.  The Common Hereditary Cancers Panel offered  by [facility] includes sequencing and/or deletion duplication testing of the following 48 genes: APC, ATM, AXIN2, BARD1, BMPR1A, BRCA1, BRCA2, BRIP1, CDH1, CDKN2A (p14ARF), CDKN2A (p16INK4a), CKD4, CHEK2, CTNNA1, DICER1, EPCAM (Deletion/duplication testing only), GREM1 (promoter region deletion/duplication testing only), KIT, MEN1, MLH1, MSH2, MSH3, MSH6, MUTYH, NBN, NF1, NHTL1, PALB2, PDGFRA, PMS2, POLD1, POLE, PTEN, RAD50, RAD51C, RAD51D, RNF43, SDHB, SDHC, SDHD, SMAD4, SMARCA4. STK11, TP53, TSC1, TSC2, and VHL.  The following genes were evaluated for sequence changes only: SDHA and HOXB13 c.251G>A variant only. The report date is 01/26/2019.    02/10/2019 Cancer Staging   Staging form: Breast, AJCC 8th Edition - Pathologic stage from 02/10/2019: Stage IA (pT1b, pN0, cM0, G2, ER+, PR+, HER2-) - Signed by [name], NP on 02/24/2019   02/10/2019 Surgery   Left lumpectomy Judith Dixon) [phone]): IDC, grade 2, 0.6cm, with intermediate grade DCIS, clear margins, 4 left axillary lymph nodes negative    03/02/2019 Oncotype testing   The Oncotype DX score was 13 predicting a risk of outside the breast recurrence over the next 9 years of 4% if the patient's only systemic therapy is tamoxifen for 5 years.    04/06/2019 - 05/03/2019 Radiation Therapy   The patient initially received a dose of 40.05 Gy in 15 fractions to the breast using whole-breast tangent fields. This was delivered using a 3-D conformal technique. The pt received a boost delivering an additional 12 Gy in 6 fractions using a electron boost with 81mV electrons. The total dose was 52.05 Gy.    05/24/2019 -  Anti-estrogen oral therapy   Anastrozole could not tolerate it because of severe hot flashes and joint stiffness at even half the dose, switched to letrozole (she will start at 1/4  tablet daily) on 12/21/2019     Observations/Objective:     Assessment Plan:  Malignant neoplasm of upper-outer quadrant of left breast in  female, estrogen receptor positive ([place]) 02/10/2019:Left lumpectomy Judith Dixon): IDC, grade 2, 0.6cm, with intermediate grade DCIS, clear margins, 4 left axillary lymph nodes negativeT1BN0 stage Ia Oncotype DX score 13: Low risk: 4%Risk of distant recurrence  Oncotype discussion: I discussed a low risk Oncotype score and that there is no benefit to systemic chemotherapy.  Treatment plan: 1.Adjuvant radiation 2.Follow-up adjuvant antiestrogen therapy with anastrozole 1 mg daily x5 yearsstarted 05/24/2019 discontinued September 2021. She could not tolerate half a dose of anastrozole. Switched to letrozole 1/4 tablet daily 12/21/2019 --------------------------------------------------------------------------------------- Patient was very reluctant to consider antiestrogen therapy because of the side effects. She is very worried about high cholesterol Alzheimer's etc.  Letrozole toxicities: 1. Hot flashes: Manageable  2. Sleep: hot flash at 2 AM.  (Patient takes a THC-based gummy and it helps her go back to sleep) 3. Hair thinning: Using biotin  Breast cancer surveillance: Mammogram 11/16/2019: At [facility] benign, breast density category C   RTC in 1 year for follow up    I discussed the assessment and treatment plan with the patient. The patient was provided an opportunity to ask questions and all were answered. The patient agreed with the plan and demonstrated an understanding of the instructions. The patient was advised to call back or seek an in-person evaluation if the symptoms worsen or if the condition fails to improve as anticipated.   I provided 15 minutes of non-face-to-face time during this encounter.   Rulon Eisenmenger, MD 02/14/2020    I, Molly Dorshimer, am acting as scribe for [name], MD.  I have reviewed the above documentation for accuracy and completeness, and I agree with the above.

## 2020-02-14 ENCOUNTER — Inpatient Hospital Stay: Payer: BC Managed Care – PPO | Attending: Hematology and Oncology | Admitting: Hematology and Oncology

## 2020-02-14 DIAGNOSIS — Z170 Estrogen receptor positive status [ER+]: Secondary | ICD-10-CM | POA: Diagnosis not present

## 2020-02-14 DIAGNOSIS — C50412 Malignant neoplasm of upper-outer quadrant of left female breast: Secondary | ICD-10-CM | POA: Diagnosis not present

## 2020-02-14 NOTE — Assessment & Plan Note (Signed)
02/10/2019:Left lumpectomy Judith Dixon): IDC, grade 2, 0.6cm, with intermediate grade DCIS, clear margins, 4 left axillary lymph nodes negativeT1BN0 stage Ia Oncotype DX score 13: Low risk: 4%Risk of distant recurrence  Oncotype discussion: I discussed a low risk Oncotype score and that there is no benefit to systemic chemotherapy.  Treatment plan: 1.Adjuvant radiation 2.Follow-up adjuvant antiestrogen therapy with anastrozole 1 mg daily x5 yearsstarted 05/24/2019 discontinued September 2021. She could not tolerate half a dose of anastrozole. Switched to letrozole 1/4 tablet daily 12/21/2019 --------------------------------------------------------------------------------------- Patient is very reluctant to consider antiestrogen therapy because of the side effects. She is very worried about high cholesterol Alzheimer's etc.  Letrozole toxicities: 1. Hot flashes: Manageable 2. Sleep: hot flash at 2 AM.  Breast cancer surveillance: 1. breast exam 02/14/2020: Benign 2. Mammogram 11/16/2019: At [facility] benign, breast density category C   RTC in 1 year for follow up

## 2020-02-16 ENCOUNTER — Telehealth: Payer: Self-pay | Admitting: Hematology and Oncology

## 2020-02-16 NOTE — Telephone Encounter (Signed)
No 1/10 los, no changes made to pt schedule

## 2020-02-25 ENCOUNTER — Encounter (INDEPENDENT_AMBULATORY_CARE_PROVIDER_SITE_OTHER): Payer: Self-pay

## 2020-03-03 ENCOUNTER — Encounter (INDEPENDENT_AMBULATORY_CARE_PROVIDER_SITE_OTHER): Payer: Self-pay

## 2020-03-17 ENCOUNTER — Encounter (INDEPENDENT_AMBULATORY_CARE_PROVIDER_SITE_OTHER): Payer: Self-pay

## 2020-04-14 ENCOUNTER — Encounter (INDEPENDENT_AMBULATORY_CARE_PROVIDER_SITE_OTHER): Payer: Self-pay

## 2020-04-21 ENCOUNTER — Encounter (INDEPENDENT_AMBULATORY_CARE_PROVIDER_SITE_OTHER): Payer: Self-pay

## 2020-04-29 ENCOUNTER — Encounter (INDEPENDENT_AMBULATORY_CARE_PROVIDER_SITE_OTHER): Payer: Self-pay

## 2020-05-05 ENCOUNTER — Encounter (INDEPENDENT_AMBULATORY_CARE_PROVIDER_SITE_OTHER): Payer: Self-pay

## 2020-06-03 ENCOUNTER — Other Ambulatory Visit: Payer: Self-pay | Admitting: Hematology and Oncology

## 2020-06-05 MED ORDER — LETROZOLE 2.5 MG PO TABS: 2.5000 mg | ORAL_TABLET | Freq: Every day | ORAL | 11 refills | Status: DC

## 2020-06-08 ENCOUNTER — Other Ambulatory Visit (HOSPITAL_COMMUNITY)
Admission: RE | Admit: 2020-06-08 | Discharge: 2020-06-08 | Disposition: A | Discharge status: Home / Self Care | Payer: BC Managed Care – PPO | Source: Ambulatory Visit | Attending: Family Medicine | Admitting: Family Medicine

## 2020-06-08 DIAGNOSIS — Z124 Encounter for screening for malignant neoplasm of cervix: Secondary | ICD-10-CM | POA: Diagnosis not present

## 2020-06-09 LAB — CYTOLOGY - PAP
Comment: NEGATIVE
Diagnosis: NEGATIVE
High risk HPV: NEGATIVE

## 2020-07-27 ENCOUNTER — Other Ambulatory Visit: Payer: Self-pay | Admitting: Orthopedic Surgery

## 2020-08-11 ENCOUNTER — Telehealth: Payer: Self-pay | Admitting: Hematology and Oncology

## 2020-08-11 ENCOUNTER — Telehealth: Payer: Self-pay | Admitting: *Deleted

## 2020-08-11 NOTE — Telephone Encounter (Signed)
Judith Dixon states she is having difficulty with Letrozole. Is currently taking 1/4 a tablet. When she wakes up in the mornings, her hands are stiff for about 20-30 minutes. 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 Wants to know if Dr [name] has any suggestions.

## 2020-08-11 NOTE — Telephone Encounter (Signed)
Scheduled appt per 7/8 sch msg. Pt aware.  

## 2020-08-11 NOTE — Telephone Encounter (Signed)
Message to scheduler schedule appt

## 2020-08-15 NOTE — Assessment & Plan Note (Signed)
02/10/2019:Left lumpectomy [name]): IDC, grade 2, 0.6cm, with intermediate grade DCIS, clear margins, 4 left axillary lymph nodes negativeT1BN0 stage Ia Oncotype DX score 13: Low risk: 4%Risk of distant recurrence  Oncotype discussion: I discussed a low risk Oncotype score and that there is no benefit to systemic chemotherapy.  Treatment plan: 1.Adjuvant radiation 2.Follow-up adjuvant antiestrogen therapy with anastrozole 1 mg daily x5 yearsstarted 05/24/2019 discontinued September 2021. She could not tolerate half a dose of anastrozole. Switched to letrozole 1/4 tablet daily 12/21/2019 --------------------------------------------------------------------------------------- Patient was very reluctant to consider antiestrogen therapy because of the side effects. She is very worried about high cholesterol Alzheimer's etc.  Letrozole toxicities: 1. Hot flashes: Manageable 2. Sleep: hot flash at 2 AM.  (Patient takes a THC-based gummy and it helps her go back to sleep) 3. Hair thinning: Using biotin  Breast cancer surveillance: Mammogram 11/16/2019: At [facility] benign, breast density category C  RTC in 1 year for follow up

## 2020-08-15 NOTE — Progress Notes (Signed)
Patient Care Team: Judith Huxley, PA as PCP - General (Family Medicine) Judith Bookbinder, MD as Consulting Physician (General Surgery) Judith Lose, MD as Consulting Physician (Hematology and Oncology) Judith Pray, MD as Consulting Physician (Radiation Oncology)  DIAGNOSIS:    ICD-10-CM   1. Malignant neoplasm of upper-outer quadrant of left breast in female, estrogen receptor positive ([facility])  C50.412    Z17.0       SUMMARY OF ONCOLOGIC HISTORY: Oncology History  Malignant neoplasm of upper-outer quadrant of left breast in female, estrogen receptor positive ([place])  01/05/2019 Cancer Staging   Staging form: Breast, AJCC 8th Edition - Clinical stage from 01/05/2019: Stage IA (cT1b, cN0, cM0, G2, ER+, PR+, HER2-)   01/05/2019 Initial Diagnosis   Routine screening mammogram detected a 0.6cm architectural distortion in the left breast. Biopsy showed IDC with DCIS, grade 2, HER-2 - by FISH, ER+ 90%, PR+ 85%, Ki67 15%.   01/27/2019 Genetic Testing   Negative genetic testing. No pathogenic variants identified. VUS in CHEK2 called c.8G>A identified on the [facility] Breast Cancer STAT Panel + Common Hereditary Cancers Panel. The STAT Breast cancer panel offered by [facility] includes sequencing and rearrangement analysis for the following 9 genes:  ATM, BRCA1, BRCA2, CDH1, CHEK2, PALB2, PTEN, STK11 and TP53.  The Common Hereditary Cancers Panel offered by [facility] includes sequencing and/or deletion duplication testing of the following 48 genes: APC, ATM, AXIN2, BARD1, BMPR1A, BRCA1, BRCA2, BRIP1, CDH1, CDKN2A (p14ARF), CDKN2A (p16INK4a), CKD4, CHEK2, CTNNA1, DICER1, EPCAM (Deletion/duplication testing only), GREM1 (promoter region deletion/duplication testing only), KIT, MEN1, MLH1, MSH2, MSH3, MSH6, MUTYH, NBN, NF1, NHTL1, PALB2, PDGFRA, PMS2, POLD1, POLE, PTEN, RAD50, RAD51C, RAD51D, RNF43, SDHB, SDHC, SDHD, SMAD4, SMARCA4. STK11, TP53, TSC1, TSC2, and VHL.  The following genes were evaluated for  sequence changes only: SDHA and HOXB13 c.251G>A variant only. The report date is 01/26/2019.    02/10/2019 Cancer Staging   Staging form: Breast, AJCC 8th Edition - Pathologic stage from 02/10/2019: Stage IA (pT1b, pN0, cM0, G2, ER+, PR+, HER2-) - Signed by Judith Phlegm, NP on 02/24/2019    02/10/2019 Surgery   Left lumpectomy Judith Dixon) [phone]): IDC, grade 2, 0.6cm, with intermediate grade DCIS, clear margins, 4 left axillary lymph nodes negative    03/02/2019 Oncotype testing   The Oncotype DX score was 13 predicting a risk of outside the breast recurrence over the next 9 years of 4% if the patient's only systemic therapy is tamoxifen for 5 years.    04/06/2019 - 05/03/2019 Radiation Therapy   The patient initially received a dose of 40.05 Gy in 15 fractions to the breast using whole-breast tangent fields. This was delivered using a 3-D conformal technique. The pt received a boost delivering an additional 12 Gy in 6 fractions using a electron boost with 38mV electrons. The total dose was 52.05 Gy.    05/24/2019 -  Anti-estrogen oral therapy   Anastrozole could not tolerate it because of severe hot flashes and joint stiffness at even half the dose, switched to letrozole (she will start at 1/4 tablet daily) on 12/21/2019     CHIEF COMPLIANT: Follow-up of left breast cancer on letrozole therapy  INTERVAL HISTORY: Judith Dixon a 60y.o. with above-mentioned history of  left breast cancer who underwent a lumpectomy, radiation, and is currently on letrozole, as she could not tolerate anastrozole. She reports to the clinic today for follow-up.  She is having profound trigger fingers and the stiffness of her hands.  She is needing to undergo  surgery on her thumb to release her trigger thumb.  The rest of the fingers also cramp up severely in the morning and it takes a long time for her to loosen up her hands.  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  ALLERGIES:  has no active allergies.  MEDICATIONS:  Current Outpatient Medications  Medication Sig Dispense Refill   [START ON 09/18/2020] tamoxifen (NOLVADEX) 10 MG tablet Take 0.5 tablets (5 mg total) by mouth daily. 90 tablet 1   Biotin 1 MG CAPS Take by mouth.     Cholecalciferol (VITAMIN D3) 50 MCG (2000 UT) capsule Take 2,000 Units by mouth daily.     EPINEPHrine 0.3 mg/0.3 mL IJ SOAJ injection USE AS DIRECTED FOR SEVERE ALLERGIC REACTIONS     Multiple Vitamin (MULTIVITAMIN) tablet Take 1 tablet by mouth daily.     OVER THE COUNTER MEDICATION Natural vitamin for hair growth     No current facility-administered medications for this visit.    PHYSICAL EXAMINATION: ECOG PERFORMANCE STATUS: 1 - Symptomatic but completely ambulatory  Vitals:   08/16/20 0837  BP: 125/81  Pulse: 74  Resp: 18  Temp: 98.1 F (36.7 C)  SpO2: 100%   Filed Weights   08/16/20 0837  Weight: 138 lb 8 oz (62.8 kg)      LABORATORY DATA:  I have reviewed the data as listed CMP Latest Ref Rng & Units 08/10/2019 04/30/2019  Glucose 70 - 99 mg/dL 84 92  BUN 6 - 20 mg/dL 15 16  Creatinine 0.44 - 1.00 mg/dL 0.77 0.85  Sodium 135 - 145 mmol/L 141 142  Potassium 3.5 - 5.1 mmol/L 4.1 4.0  Chloride 98 - 111 mmol/L 107 104  CO2 22 - 32 mmol/L 26 29  Calcium 8.9 - 10.3 mg/dL 9.9 9.4  Total Protein 6.5 - 8.1 g/dL 7.4 7.2  Total Bilirubin 0.3 - 1.2 mg/dL 0.8 0.8  Alkaline Phos 38 - 126 U/L 78 78  AST 15 - 41 U/L 19 18  ALT 0 - 44 U/L 19 18    Lab Results  Component Value Date   WBC 5.9 08/10/2019   HGB 12.6 08/10/2019   HCT 37.9 08/10/2019   MCV 93.6 08/10/2019   PLT 184 08/10/2019   NEUTROABS 3.8 08/10/2019    ASSESSMENT & PLAN:  Malignant neoplasm of upper-outer quadrant of left breast in female, estrogen receptor positive ([place]) 02/10/2019:Left lumpectomy Judith Dixon): IDC, grade 2, 0.6cm, with intermediate grade DCIS, clear margins, 4 left axillary  lymph nodes negative T1BN0 stage Ia Oncotype DX score 13: Low risk: 4%Risk of distant recurrence   Treatment plan: 1.  Adjuvant radiation 2.  Follow-up adjuvant antiestrogen therapy with anastrozole 1 mg daily x5 years started 05/24/2019 discontinued September 2021.  She could not tolerate half a dose of anastrozole.  Switched to letrozole 1/4 tablet daily 12/21/2019, switched to tamoxifen 5 mg daily 09/18/2020 ---------------------------------------------------------------------------------------   Letrozole toxicities:  1. Hot flashes: Manageable 2. Sleep: hot flash at 2 AM.  (Patient takes a THC-based gummy and it helps her go back to sleep) 3. Hair thinning: Using biotin 4.  Trigger fingers and trigger thumb: She is having extraordinary amount of pain and discomfort even at one fourth dose of letrozole.  Therefore I recommend that she discontinue it at this time.  I sent a prescription for tamoxifen.  She will take half a tablet daily starting middle of August.  Breast cancer surveillance: Mammogram 11/16/2019: At [facility] benign, breast density category C   I will connect with her in September for toxicity evaluation on tamoxifen 5 mg dose.    No orders of the defined types were placed in this encounter.  The patient has a good understanding of the overall plan. she agrees with it. she will call with any problems that may develop before the next visit here.  Total time spent: 30 mins including face to face time and time spent for planning, charting and coordination of care  Rulon Eisenmenger, MD, MPH 08/16/2020  I, Thana Ates, am acting as scribe for Dr. Nicholas Dixon.  I have reviewed the above documentation for accuracy and completeness, and I agree with the above.

## 2020-08-16 ENCOUNTER — Other Ambulatory Visit: Payer: Self-pay

## 2020-08-16 ENCOUNTER — Inpatient Hospital Stay: Payer: BC Managed Care – PPO | Attending: Hematology and Oncology | Admitting: Hematology and Oncology

## 2020-08-16 DIAGNOSIS — Z170 Estrogen receptor positive status [ER+]: Secondary | ICD-10-CM | POA: Diagnosis not present

## 2020-08-16 DIAGNOSIS — Z79811 Long term (current) use of aromatase inhibitors: Secondary | ICD-10-CM | POA: Insufficient documentation

## 2020-08-16 DIAGNOSIS — Z923 Personal history of irradiation: Secondary | ICD-10-CM | POA: Insufficient documentation

## 2020-08-16 DIAGNOSIS — C50412 Malignant neoplasm of upper-outer quadrant of left female breast: Secondary | ICD-10-CM | POA: Diagnosis not present

## 2020-08-16 DIAGNOSIS — Z79899 Other long term (current) drug therapy: Secondary | ICD-10-CM | POA: Insufficient documentation

## 2020-08-16 DIAGNOSIS — R232 Flushing: Secondary | ICD-10-CM | POA: Insufficient documentation

## 2020-08-16 MED ORDER — TAMOXIFEN CITRATE 10 MG PO TABS: 5.0000 mg | ORAL_TABLET | Freq: Every day | ORAL | 1 refills | Status: DC

## 2020-08-31 ENCOUNTER — Encounter (HOSPITAL_BASED_OUTPATIENT_CLINIC_OR_DEPARTMENT_OTHER): Payer: Self-pay | Admitting: Orthopedic Surgery

## 2020-08-31 ENCOUNTER — Other Ambulatory Visit: Payer: Self-pay

## 2020-09-01 NOTE — Progress Notes (Signed)

## 2020-09-08 ENCOUNTER — Other Ambulatory Visit: Payer: Self-pay

## 2020-09-08 ENCOUNTER — Ambulatory Visit (HOSPITAL_BASED_OUTPATIENT_CLINIC_OR_DEPARTMENT_OTHER)
Admission: RE | Admit: 2020-09-08 | Discharge: 2020-09-08 | Disposition: A | Discharge status: Home / Self Care | Payer: BC Managed Care – PPO | Attending: Orthopedic Surgery | Admitting: Orthopedic Surgery

## 2020-09-08 ENCOUNTER — Encounter (HOSPITAL_BASED_OUTPATIENT_CLINIC_OR_DEPARTMENT_OTHER)
Admission: RE | Disposition: A | Discharge status: Home / Self Care | Payer: Self-pay | Source: Home / Self Care | Attending: Orthopedic Surgery

## 2020-09-08 ENCOUNTER — Ambulatory Visit (HOSPITAL_BASED_OUTPATIENT_CLINIC_OR_DEPARTMENT_OTHER): Payer: BC Managed Care – PPO | Admitting: Certified Registered"

## 2020-09-08 ENCOUNTER — Encounter (HOSPITAL_BASED_OUTPATIENT_CLINIC_OR_DEPARTMENT_OTHER): Payer: Self-pay | Admitting: Orthopedic Surgery

## 2020-09-08 DIAGNOSIS — Z8249 Family history of ischemic heart disease and other diseases of the circulatory system: Secondary | ICD-10-CM | POA: Diagnosis not present

## 2020-09-08 DIAGNOSIS — Z8371 Family history of colonic polyps: Secondary | ICD-10-CM | POA: Insufficient documentation

## 2020-09-08 DIAGNOSIS — Z79899 Other long term (current) drug therapy: Secondary | ICD-10-CM | POA: Diagnosis not present

## 2020-09-08 DIAGNOSIS — Z8349 Family history of other endocrine, nutritional and metabolic diseases: Secondary | ICD-10-CM | POA: Diagnosis not present

## 2020-09-08 DIAGNOSIS — M65311 Trigger thumb, right thumb: Secondary | ICD-10-CM | POA: Diagnosis not present

## 2020-09-08 DIAGNOSIS — Z803 Family history of malignant neoplasm of breast: Secondary | ICD-10-CM | POA: Insufficient documentation

## 2020-09-08 DIAGNOSIS — Z806 Family history of leukemia: Secondary | ICD-10-CM | POA: Insufficient documentation

## 2020-09-08 DIAGNOSIS — Z800 Family history of malignant neoplasm of digestive organs: Secondary | ICD-10-CM | POA: Diagnosis not present

## 2020-09-08 DIAGNOSIS — Z8261 Family history of arthritis: Secondary | ICD-10-CM | POA: Diagnosis not present

## 2020-09-08 DIAGNOSIS — Z8042 Family history of malignant neoplasm of prostate: Secondary | ICD-10-CM | POA: Diagnosis not present

## 2020-09-08 HISTORY — PX: TRIGGER FINGER RELEASE: SHX641

## 2020-09-08 SURGERY — RELEASE, A1 PULLEY, FOR TRIGGER FINGER
Anesthesia: Regional | Site: Hand | Laterality: Right

## 2020-09-08 MED ORDER — CEFAZOLIN SODIUM-DEXTROSE 2-4 GM/100ML-% IV SOLN
INTRAVENOUS | Status: AC
  Filled 2020-09-08: qty 100

## 2020-09-08 MED ORDER — CEFAZOLIN SODIUM-DEXTROSE 2-4 GM/100ML-% IV SOLN
2.0000 g | INTRAVENOUS | Status: AC
  Administered 2020-09-08: 2 g via INTRAVENOUS

## 2020-09-08 MED ORDER — LACTATED RINGERS IV SOLN: INTRAVENOUS | Status: DC

## 2020-09-08 MED ORDER — PROPOFOL 500 MG/50ML IV EMUL
INTRAVENOUS | Status: DC | PRN
  Administered 2020-09-08: 50 ug/kg/min via INTRAVENOUS

## 2020-09-08 MED ORDER — PROMETHAZINE HCL 25 MG/ML IJ SOLN: 6.2500 mg | INTRAMUSCULAR | Status: DC | PRN

## 2020-09-08 MED ORDER — MIDAZOLAM HCL 2 MG/2ML IJ SOLN
INTRAMUSCULAR | Status: DC | PRN
  Administered 2020-09-08: 2 mg via INTRAVENOUS

## 2020-09-08 MED ORDER — LIDOCAINE HCL (PF) 0.5 % IJ SOLN
INTRAMUSCULAR | Status: DC | PRN
  Administered 2020-09-08: 30 mL via INTRAVENOUS

## 2020-09-08 MED ORDER — 0.9 % SODIUM CHLORIDE (POUR BTL) OPTIME
TOPICAL | Status: DC | PRN
  Administered 2020-09-08: 50 mL

## 2020-09-08 MED ORDER — MEPERIDINE HCL 25 MG/ML IJ SOLN: 6.2500 mg | INTRAMUSCULAR | Status: DC | PRN

## 2020-09-08 MED ORDER — MIDAZOLAM HCL 2 MG/2ML IJ SOLN
INTRAMUSCULAR | Status: AC
  Filled 2020-09-08: qty 2

## 2020-09-08 MED ORDER — BUPIVACAINE HCL (PF) 0.25 % IJ SOLN
INTRAMUSCULAR | Status: DC | PRN
  Administered 2020-09-08: 9 mL

## 2020-09-08 MED ORDER — PROPOFOL 10 MG/ML IV BOLUS
INTRAVENOUS | Status: DC | PRN
  Administered 2020-09-08: 20 mg via INTRAVENOUS

## 2020-09-08 MED ORDER — HYDROCODONE-ACETAMINOPHEN 5-325 MG PO TABS: ORAL_TABLET | ORAL | 0 refills | Status: DC

## 2020-09-08 MED ORDER — BUPIVACAINE HCL (PF) 0.25 % IJ SOLN
INTRAMUSCULAR | Status: AC
  Filled 2020-09-08: qty 30

## 2020-09-08 MED ORDER — FENTANYL CITRATE (PF) 100 MCG/2ML IJ SOLN: 25.0000 ug | INTRAMUSCULAR | Status: DC | PRN

## 2020-09-08 MED ORDER — ONDANSETRON HCL 4 MG/2ML IJ SOLN
INTRAMUSCULAR | Status: DC | PRN
  Administered 2020-09-08: 4 mg via INTRAVENOUS

## 2020-09-08 MED ORDER — ONDANSETRON HCL 4 MG/2ML IJ SOLN
INTRAMUSCULAR | Status: AC
  Filled 2020-09-08: qty 2

## 2020-09-08 MED ORDER — PROPOFOL 500 MG/50ML IV EMUL
INTRAVENOUS | Status: AC
  Filled 2020-09-08: qty 50

## 2020-09-08 MED ORDER — FENTANYL CITRATE (PF) 100 MCG/2ML IJ SOLN
INTRAMUSCULAR | Status: DC | PRN
  Administered 2020-09-08 (×2): 25 ug via INTRAVENOUS
  Administered 2020-09-08: 50 ug via INTRAVENOUS

## 2020-09-08 MED ORDER — FENTANYL CITRATE (PF) 100 MCG/2ML IJ SOLN
INTRAMUSCULAR | Status: AC
  Filled 2020-09-08: qty 2

## 2020-09-08 SURGICAL SUPPLY — 30 items
APL PRP STRL LF DISP 70% ISPRP (MISCELLANEOUS) ×1
BLADE SURG 15 STRL LF DISP TIS (BLADE) ×2 IMPLANT
BLADE SURG 15 STRL SS (BLADE) ×4
BNDG CMPR 9X4 STRL LF SNTH (GAUZE/BANDAGES/DRESSINGS)
BNDG COHESIVE 2X5 TAN ST LF (GAUZE/BANDAGES/DRESSINGS) ×2 IMPLANT
BNDG ESMARK 4X9 LF (GAUZE/BANDAGES/DRESSINGS) IMPLANT
CHLORAPREP W/TINT 26 (MISCELLANEOUS) ×2 IMPLANT
CORD BIPOLAR FORCEPS 12FT (ELECTRODE) ×2 IMPLANT
COVER BACK TABLE 60X90IN (DRAPES) ×2 IMPLANT
COVER MAYO STAND STRL (DRAPES) ×2 IMPLANT
CUFF TOURN SGL QUICK 18X4 (TOURNIQUET CUFF) ×2 IMPLANT
DRAPE EXTREMITY T 121X128X90 (DISPOSABLE) ×2 IMPLANT
DRAPE SURG 17X23 STRL (DRAPES) ×2 IMPLANT
GAUZE SPONGE 4X4 12PLY STRL (GAUZE/BANDAGES/DRESSINGS) ×2 IMPLANT
GAUZE XEROFORM 1X8 LF (GAUZE/BANDAGES/DRESSINGS) ×2 IMPLANT
GLOVE SRG 8 PF TXTR STRL LF DI (GLOVE) ×1 IMPLANT
GLOVE SURG ENC MOIS LTX SZ7.5 (GLOVE) ×2 IMPLANT
GLOVE SURG UNDER POLY LF SZ8 (GLOVE) ×2
GOWN STRL REUS W/ TWL LRG LVL3 (GOWN DISPOSABLE) ×1 IMPLANT
GOWN STRL REUS W/TWL LRG LVL3 (GOWN DISPOSABLE) ×2
GOWN STRL REUS W/TWL XL LVL3 (GOWN DISPOSABLE) ×2 IMPLANT
NEEDLE HYPO 25X1 1.5 SAFETY (NEEDLE) ×2 IMPLANT
NS IRRIG 1000ML POUR BTL (IV SOLUTION) ×2 IMPLANT
PACK BASIN DAY SURGERY FS (CUSTOM PROCEDURE TRAY) ×2 IMPLANT
STOCKINETTE 4X48 STRL (DRAPES) ×2 IMPLANT
SUT ETHILON 4 0 PS 2 18 (SUTURE) ×2 IMPLANT
SYR BULB EAR ULCER 3OZ GRN STR (SYRINGE) ×2 IMPLANT
SYR CONTROL 10ML LL (SYRINGE) ×2 IMPLANT
TOWEL GREEN STERILE FF (TOWEL DISPOSABLE) ×4 IMPLANT
UNDERPAD 30X36 HEAVY ABSORB (UNDERPADS AND DIAPERS) ×2 IMPLANT

## 2020-09-08 NOTE — Transfer of Care (Signed)
Immediate Anesthesia Transfer of Care Note  Patient: Judith Dixon  Procedure(s) Performed: RELEASE TRIGGER FINGER/A-1 PULLEY RIGHT THUMB (Right: Hand)  Patient Location: PACU  Anesthesia Type:MAC and Bier block  Level of Consciousness: awake  Airway & Oxygen Therapy: Patient Spontanous Breathing and Patient connected to face mask oxygen  Post-op Assessment: Report given to RN and Post -op Vital signs reviewed and stable  Post vital signs: Reviewed and stable  Last Vitals:  Vitals Value Taken Time  BP    Temp    Pulse 77 09/08/20 1355  Resp 12 09/08/20 1355  SpO2 100 % 09/08/20 1355  Vitals shown include unvalidated device data.  Last Pain:  Vitals:   09/08/20 1213  TempSrc: Oral  PainSc: 5       Patients Stated Pain Goal: 5 ([id number])  Complications: No notable events documented.

## 2020-09-08 NOTE — Anesthesia Procedure Notes (Signed)
Anesthesia Regional Block: Bier block (IV Regional)   Pre-Anesthetic Checklist: , timeout performed,  Correct Patient, Correct Site, Correct Laterality,  Correct Procedure, Correct Position, site marked,  Risks and benefits discussed,  Surgical consent,  Pre-op evaluation,  At surgeon's request and post-op pain management  Laterality: Right  Prep: alcohol swabs       Needles:  Injection technique: Single-shot       Needle Gauge: 22     Additional Needles:   Procedures:,,,,, intact distal pulses, Esmarch exsanguination,  Single tourniquet utilized    Narrative:  Start time: 09/08/2020 1:30 PM End time: 09/08/2020 1:31 PM  Performed by: Personally

## 2020-09-08 NOTE — Anesthesia Preprocedure Evaluation (Signed)
Anesthesia Evaluation  Patient identified by MRN, date of birth, ID band Patient awake    Reviewed: Allergy & Precautions, NPO status , Patient's Chart, lab work & pertinent test results  History of Anesthesia Complications (+) PROLONGED EMERGENCE and history of anesthetic complications  Airway Mallampati: I  TM Distance: >3 FB Neck ROM: Full    Dental no notable dental hx. (+) Teeth Intact   Pulmonary neg pulmonary ROS,    Pulmonary exam normal breath sounds clear to auscultation       Cardiovascular negative cardio ROS Normal cardiovascular exam Rhythm:Regular Rate:Normal     Neuro/Psych negative neurological ROS  negative psych ROS   GI/Hepatic negative GI ROS, Neg liver ROS,   Endo/Other  negative endocrine ROS  Renal/GU negative Renal ROS     Musculoskeletal negative musculoskeletal ROS (+)   Abdominal Normal abdominal exam  (+)   Peds  Hematology negative hematology ROS (+)   Anesthesia Other Findings   Reproductive/Obstetrics negative OB ROS                             Anesthesia Physical  Anesthesia Plan  ASA: 2  Anesthesia Plan: Bier Block and Bier Block-LIDOCAINE ONLY   Post-op Pain Management:    Induction: Intravenous  PONV Risk Score and Plan: 2 and Ondansetron, Midazolam, Treatment may vary due to age or medical condition and Propofol infusion  Airway Management Planned:   Additional Equipment: None  Intra-op Plan:   Post-operative Plan:   Informed Consent: I have reviewed the patients History and Physical, chart, labs and discussed the procedure including the risks, benefits and alternatives for the proposed anesthesia with the patient or authorized representative who has indicated his/her understanding and acceptance.     Dental advisory given  Plan Discussed with: CRNA  Anesthesia Plan Comments:         Anesthesia Quick Evaluation

## 2020-09-08 NOTE — Discharge Instructions (Addendum)

## 2020-09-08 NOTE — Op Note (Signed)
09/08/2020 [facility] OPERATIVE REPORT   PREOPERATIVE DIAGNOSIS: Right trigger thumb.  POSTOPERATIVE DIAGNOSIS:  Right trigger thumb.  PROCEDURE: Right trigger thumb release.  SURGEON:  Leanora Cover, MD  ASSISTANT:  None.  ANESTHESIA:  Bier block with sedation.  IV FLUIDS:  Per anesthesia flow sheet.  ESTIMATED BLOOD LOSS:  Minimal.  COMPLICATIONS:  None.  SPECIMENS:  None.  TOURNIQUET TIME:  Total Tourniquet Time Documented: Forearm (Right) - 18 minutes Total: Forearm (Right) - 18 minutes   DISPOSITION:  Stable to PACU.  LOCATION: [facility]  INDICATIONS: Judith Dixon is a 60 y.o. female with triggering right thumb.  This is bothersome.  She wishes to have right thumb trigger release.  Risks, benefits and alternatives of surgery were discussed including the risk of blood loss, infection, damage to nerves, vessels, tendons, ligaments, bone, failure of surgery, need for additional surgery, complications with wound healing, continued pain, continued triggering and need for repeat surgery.  She voiced understanding of these risks and elected to proceed.  OPERATIVE COURSE:  After being identified preoperatively by myself, the patient and I agreed upon the procedure and site of procedure.  The surgical site was marked. Surgical consent had been signed. She was given preoperative IV antibiotic prophylaxis. She was transported to the operating room and placed on the operating room table in supine position with the Right upper extremity on an arm board. Bier block anesthesia was induced by the anesthesiologist.  The Right upper extremity was prepped and draped in normal sterile orthopedic fashion. A surgical pause was performed between surgeons, anesthesia, and operating room staff, and all were in agreement as to the patient, procedure, and site of procedure.  Tourniquet at the proximal aspect of the forearm had been inflated for the Bier block.  An  incision was made transversely at the MP flexion crease of the thumb.  This was made through the skin only.  Spreading technique was used.  The radial and ulnar digital nerves were identified and were protected throughout the case. The flexor sheath was identified.  The A1 pulley was identified.  It was sharply incised.  It was released in its entirety.  Care was taken to avoid any release of the oblique pulley. The thumb was placed through a range of motion, there was noted to be no catching.  The wound was copiously irrigated with sterile saline. It was then closed with 4-0 nylon in a horizontal mattress fashion.  The wound was injected with  0.25% plain Marcaine to aid in postoperative analgesia.  It was then dressed with sterile Xeroform, 4x4s, and wrapped lightly with a Coban dressing.  Tourniquet was deflated at 18 minutes.  The fingertips were pink with brisk capillary refill after deflation of the tourniquet.  The operative drapes were broken down and the patient was awoken from anesthesia safely.  She was transferred back to the stretcher and taken to the PACU in stable condition.   I will see her back in the office in 1 week for postoperative followup.  I will give her a prescription for Norco 5/325 1-2 tabs PO q6 hours prn pain, dispense #15.    Leanora Cover, MD Electronically signed, 09/08/20

## 2020-09-08 NOTE — H&P (Signed)
Judith Dixon is an 60 y.o. female.   Chief Complaint: trigger digit HPI: 60 yo female with triggering right thumb.  This has been injected without lasting resolution.  She wishes to proceed with operative trigger release.  Allergies: No Known Allergies  Past Medical History:  Diagnosis Date   Allergy    Breast cancer ([place])    left breast cancer diagnosed 01-05-2019- surgery [id number]   Complication of anesthesia    hard time waking up   Family history of breast cancer    Family history of colon cancer    Family history of prostate cancer    Motion sickness     Past Surgical History:  Procedure Laterality Date   BREAST LUMPECTOMY WITH RADIOACTIVE SEED AND SENTINEL LYMPH NODE BIOPSY Left 02/10/2019   Procedure: LEFT BREAST LUMPECTOMY WITH RADIOACTIVE SEED AND LEFT AXILLARY SENTINEL LYMPH NODE BIOPSY;  Surgeon: Rolm Bookbinder, MD;  Location: [place];  Service: General;  Laterality: Left;   ENDOMETRIAL ABLATION     WISDOM TOOTH EXTRACTION     age [age]   WRIST SURGERY      Family History: Family History  Problem Relation Age of Onset   Colon cancer Maternal Grandmother 62   Colon polyps Mother    Thyroid disease Mother    Hypertension Mother    Colon polyps Father    Prostate cancer Father    Arthritis Father    Hypertension Father    Breast cancer Maternal Aunt        dx 51s   Breast cancer Cousin 59       maternal first cousin   Aneurysm Paternal Uncle    Skin cancer Maternal Grandfather    Leukemia Paternal Grandmother    Esophageal cancer Neg Hx    Rectal cancer Neg Hx    Stomach cancer Neg Hx     Social History:   reports that she has never smoked. She has never used smokeless tobacco. She reports current alcohol use. She reports that she does not use drugs.  Medications: Medications Prior to Admission  Medication Sig Dispense Refill   Biotin 1 MG CAPS Take by mouth.     Cholecalciferol (VITAMIN D3) 50 MCG (2000 UT) capsule Take 2,000  Units by mouth daily.     Multiple Vitamin (MULTIVITAMIN) tablet Take 1 tablet by mouth daily.     EPINEPHrine 0.3 mg/0.3 mL IJ SOAJ injection USE AS DIRECTED FOR SEVERE ALLERGIC REACTIONS     [START ON 09/18/2020] tamoxifen (NOLVADEX) 10 MG tablet Take 0.5 tablets (5 mg total) by mouth daily. 90 tablet 1    No results found for this or any previous visit (from the past 48 hour(s)).  No results found.   A comprehensive review of systems was negative.  Height '5\' 4"'$  (1.626 m), weight 61.2 kg.  General appearance: alert, cooperative, and appears stated age Head: Normocephalic, without obvious abnormality, atraumatic Neck: supple, symmetrical, trachea midline Cardio: regular rate and rhythm Resp: clear to auscultation bilaterally Extremities: Intact sensation and capillary refill all digits.  +epl/fpl/io.  No wounds.  Pulses: 2+ and symmetric Skin: Skin color, texture, turgor normal. No rashes or lesions Neurologic: Grossly normal Incision/Wound: none  Assessment/Plan Right thumb trigger digit.  Non operative and operative treatment options have been discussed with the patient and patient wishes to proceed with operative treatment. Risks, benefits, and alternatives of surgery have been discussed and the patient agrees with the plan of care.   Leanora Cover 09/08/2020, 12:02 PM

## 2020-09-11 ENCOUNTER — Encounter (HOSPITAL_BASED_OUTPATIENT_CLINIC_OR_DEPARTMENT_OTHER): Payer: Self-pay | Admitting: Orthopedic Surgery

## 2020-09-12 ENCOUNTER — Encounter (HOSPITAL_BASED_OUTPATIENT_CLINIC_OR_DEPARTMENT_OTHER): Payer: Self-pay | Admitting: Orthopedic Surgery

## 2020-09-12 NOTE — Anesthesia Postprocedure Evaluation (Signed)
Anesthesia Post Note  Patient: Judith Dixon  Procedure(s) Performed: RELEASE TRIGGER FINGER/A-1 PULLEY RIGHT THUMB (Right: Hand)     Patient location during evaluation: PACU Anesthesia Type: Bier Block Level of consciousness: awake and alert Pain management: pain level controlled Vital Signs Assessment: post-procedure vital signs reviewed and stable Respiratory status: spontaneous breathing Cardiovascular status: stable Anesthetic complications: no   No notable events documented.  Last Vitals:  Vitals:   09/08/20 1415 09/08/20 1441  BP: (!) 156/97 (!) 149/73  Pulse: 74 73  Resp: 20 16  Temp:  36.4 C  SpO2: 97% 95%    Last Pain:  Vitals:   09/08/20 1441  TempSrc:   PainSc: 0-No pain                 Nolon Nations

## 2020-10-16 NOTE — Progress Notes (Signed)
HEMATOLOGY-ONCOLOGY [facility] VIDEO VISIT PROGRESS NOTE  I connected with Judith Dixon on 10/17/2020 at  9:15 AM EDT by [place] video conference and verified that I am speaking with the correct person using two identifiers.  I discussed the limitations, risks, security and privacy concerns of performing an evaluation and management service by MyChart and the availability of in person appointments.  I also discussed with the patient that there may be a patient responsible charge related to this service. The patient expressed understanding and agreed to proceed.    CHIEF COMPLIANT: Follow-up of left breast cancer on Tamoxifen  INTERVAL HISTORY: Judith Dixon is a 60 y.o. female with above-mentioned history of  left breast cancer who underwent a lumpectomy, radiation, and is currently on letrozole, as she could not tolerate anastrozole. She reports via MyChart today for follow-up.  She is currently taking 5 mg of tamoxifen and tells me that overall the hot flashes are manageable except the fact that they are wake her up at night multiple times and is affecting her quality of her sleep.  The trigger finger surgery has helped significantly and it is no longer a problem.  Oncology History  Malignant neoplasm of upper-outer quadrant of left breast in female, estrogen receptor positive ([place])  01/05/2019 Cancer Staging   Staging form: Breast, AJCC 8th Edition - Clinical stage from 01/05/2019: Stage IA (cT1b, cN0, cM0, G2, ER+, PR+, HER2-)   01/05/2019 Initial Diagnosis   Routine screening mammogram detected a 0.6cm architectural distortion in the left breast. Biopsy showed IDC with DCIS, grade 2, HER-2 - by FISH, ER+ 90%, PR+ 85%, Ki67 15%.   01/27/2019 Genetic Testing   Negative genetic testing. No pathogenic variants identified. VUS in CHEK2 called c.8G>A identified on the [facility] Breast Cancer STAT Panel + Common Hereditary Cancers Panel. The STAT Breast cancer panel offered by [facility] includes  sequencing and rearrangement analysis for the following 9 genes:  ATM, BRCA1, BRCA2, CDH1, CHEK2, PALB2, PTEN, STK11 and TP53.  The Common Hereditary Cancers Panel offered by [facility] includes sequencing and/or deletion duplication testing of the following 48 genes: APC, ATM, AXIN2, BARD1, BMPR1A, BRCA1, BRCA2, BRIP1, CDH1, CDKN2A (p14ARF), CDKN2A (p16INK4a), CKD4, CHEK2, CTNNA1, DICER1, EPCAM (Deletion/duplication testing only), GREM1 (promoter region deletion/duplication testing only), KIT, MEN1, MLH1, MSH2, MSH3, MSH6, MUTYH, NBN, NF1, NHTL1, PALB2, PDGFRA, PMS2, POLD1, POLE, PTEN, RAD50, RAD51C, RAD51D, RNF43, SDHB, SDHC, SDHD, SMAD4, SMARCA4. STK11, TP53, TSC1, TSC2, and VHL.  The following genes were evaluated for sequence changes only: SDHA and HOXB13 c.251G>A variant only. The report date is 01/26/2019.    02/10/2019 Cancer Staging   Staging form: Breast, AJCC 8th Edition - Pathologic stage from 02/10/2019: Stage IA (pT1b, pN0, cM0, G2, ER+, PR+, HER2-) - Signed by [name], NP on 02/24/2019   02/10/2019 Surgery   Left lumpectomy [name]) [phone]): IDC, grade 2, 0.6cm, with intermediate grade DCIS, clear margins, 4 left axillary lymph nodes negative    03/02/2019 Oncotype testing   The Oncotype DX score was 13 predicting a risk of outside the breast recurrence over the next 9 years of 4% if the patient's only systemic therapy is tamoxifen for 5 years.    04/06/2019 - 05/03/2019 Radiation Therapy   The patient initially received a dose of 40.05 Gy in 15 fractions to the breast using whole-breast tangent fields. This was delivered using a 3-D conformal technique. The pt received a boost delivering an additional 12 Gy in 6 fractions using a electron boost with 64mV electrons. The total  dose was 52.05 Gy.    05/24/2019 -  Anti-estrogen oral therapy   Anastrozole could not tolerate it because of severe hot flashes and joint stiffness at even half the dose, switched to letrozole (she  will start at 1/4 tablet daily) on 12/21/2019     I have reviewed the data as listed CMP Latest Ref Rng & Units 08/10/2019 04/30/2019  Glucose 70 - 99 mg/dL 84 92  BUN 6 - 20 mg/dL 15 16  Creatinine 0.44 - 1.00 mg/dL 0.77 0.85  Sodium 135 - 145 mmol/L 141 142  Potassium 3.5 - 5.1 mmol/L 4.1 4.0  Chloride 98 - 111 mmol/L 107 104  CO2 22 - 32 mmol/L 26 29  Calcium 8.9 - 10.3 mg/dL 9.9 9.4  Total Protein 6.5 - 8.1 g/dL 7.4 7.2  Total Bilirubin 0.3 - 1.2 mg/dL 0.8 0.8  Alkaline Phos 38 - 126 U/L 78 78  AST 15 - 41 U/L 19 18  ALT 0 - 44 U/L 19 18    Lab Results  Component Value Date   WBC 5.9 08/10/2019   HGB 12.6 08/10/2019   HCT 37.9 08/10/2019   MCV 93.6 08/10/2019   PLT 184 08/10/2019   NEUTROABS 3.8 08/10/2019      Assessment Plan:  Malignant neoplasm of upper-outer quadrant of left breast in female, estrogen receptor positive ([place]) 02/10/2019:Left lumpectomy [name]): IDC, grade 2, 0.6cm, with intermediate grade DCIS, clear margins, 4 left axillary lymph nodes negative T1BN0 stage Ia Oncotype DX score 13: Low risk: 4%Risk of distant recurrence   Treatment plan: 1.  Adjuvant radiation completed 05/03/2019 2.  Adjuvant antiestrogen therapy with anastrozole 1 mg daily x5 years started 05/24/2019 discontinued September 2021.  She could not tolerate half a dose of anastrozole.  Switched to letrozole 1/4 tablet daily 12/21/2019, switched to tamoxifen 5 mg daily 09/18/2020 (plan to treat her till April 2026) --------------------------------------------------------------------------------------- Tamoxifen toxicities:  Hot flashes: at night: This is waking her up and causing her quality of life issues.  I instructed her to take the tamoxifen at different point in the day to see if that makes it any better Joint stiffness is better  Trigger fingers and trigger thumb: Status post surgery.  Doing significantly better   Breast cancer surveillance: Mammogram scheduled for 11/16/2020: At  [facility]   April 2022: Left breast mammogram and ultrasound: Benign    Return to clinic in 1 year for follow-up.  I discussed the assessment and treatment plan with the patient. The patient was provided an opportunity to ask questions and all were answered. The patient agreed with the plan and demonstrated an understanding of the instructions. The patient was advised to call back or seek an in-person evaluation if the symptoms worsen or if the condition fails to improve as anticipated.   Total time spent: 20 minutes including face-to-face MyChart video visit time and time spent for planning, charting and coordination of care  Rulon Eisenmenger, MD 10/17/2020  I, Thana Ates am acting as scribe for [name], MD.  I have reviewed the above documentation for accuracy and completeness, and I agree with the above.

## 2020-10-17 ENCOUNTER — Inpatient Hospital Stay: Payer: BC Managed Care – PPO | Attending: Hematology and Oncology | Admitting: Hematology and Oncology

## 2020-10-17 DIAGNOSIS — C50412 Malignant neoplasm of upper-outer quadrant of left female breast: Secondary | ICD-10-CM | POA: Diagnosis not present

## 2020-10-17 DIAGNOSIS — Z170 Estrogen receptor positive status [ER+]: Secondary | ICD-10-CM

## 2020-10-17 NOTE — Assessment & Plan Note (Addendum)
02/10/2019:Left lumpectomy Judith Dixon): IDC, grade 2, 0.6cm, with intermediate grade DCIS, clear margins, 4 left axillary lymph nodes negativeT1BN0 stage Ia Oncotype DX score 13: Low risk: 4%Risk of distant recurrence  Treatment plan: 1.Adjuvant radiation completed 05/03/2019 2.Adjuvant antiestrogen therapy with anastrozole 1 mg daily x5 yearsstarted 05/24/2019 discontinued September 2021. She could not tolerate half a dose of anastrozole. Switched to letrozole 1/4 tablet daily 12/21/2019, switched to tamoxifen 5 mg daily 09/18/2020 ---------------------------------------------------------------------------------------   Tamoxifen toxicities:   Trigger fingers and trigger thumb: Status post surgery.    Breast cancer surveillance: Mammogram scheduled for 11/16/2020: At [facility]   April 2022: Left breast mammogram and ultrasound: Benign    Return to clinic in 1 year for follow-up

## 2021-01-16 NOTE — Assessment & Plan Note (Deleted)
02/10/2019:Left lumpectomy Judith Dixon): IDC, grade 2, 0.6cm, with intermediate grade DCIS, clear margins, 4 left axillary lymph nodes negativeT1BN0 stage Ia Oncotype DX score 13: Low risk: 4%Risk of distant recurrence  Treatment plan: 1.Adjuvant radiation 2.Follow-up adjuvant antiestrogen therapy with anastrozole 1 mg daily x5 yearsstarted 05/24/2019 discontinued September 2021. She could not tolerate half a dose of anastrozole. Switched to letrozole 1/4 tablet daily 12/21/2019, switched to tamoxifen 5 mg daily 09/18/2020 ---------------------------------------------------------------------------------------   Letrozole toxicities: 1. Hot flashes: Manageable 2. Sleep: hot flash at 2 AM.(Patient takes a THC-based gummy and it helps her go back to sleep) 3. Hair thinning: Using biotin 4.  Trigger fingers and trigger thumb: She is having extraordinary amount of pain and discomfort even at one fourth dose of letrozole.  Therefore I recommend that she discontinue it at this time.  I sent a prescription for tamoxifen.  She will take half a tablet daily starting middle of August.    Breast cancer surveillance: Mammogram 11/16/2020: At [facility] benign, breast density category C Breast Exam: 01/17/21: benign  I will connect with her in September for toxicity evaluation on tamoxifen 5 mg dose.

## 2021-01-16 NOTE — Progress Notes (Incomplete)
Patient Care Team: [name], PA as PCP - General (Family Medicine) Rolm Bookbinder, MD as Consulting Physician (General Surgery) [name], MD as Consulting Physician (Hematology and Oncology) Gery Pray, MD as Consulting Physician (Radiation Oncology)  DIAGNOSIS: No diagnosis found.  SUMMARY OF ONCOLOGIC HISTORY: Oncology History  Malignant neoplasm of upper-outer quadrant of left breast in female, estrogen receptor positive ([place])  01/05/2019 Cancer Staging   Staging form: Breast, AJCC 8th Edition - Clinical stage from 01/05/2019: Stage IA (cT1b, cN0, cM0, G2, ER+, PR+, HER2-)   01/05/2019 Initial Diagnosis   Routine screening mammogram detected a 0.6cm architectural distortion in the left breast. Biopsy showed IDC with DCIS, grade 2, HER-2 - by FISH, ER+ 90%, PR+ 85%, Ki67 15%.   01/27/2019 Genetic Testing   Negative genetic testing. No pathogenic variants identified. VUS in CHEK2 called c.8G>A identified on the [facility] Breast Cancer STAT Panel + Common Hereditary Cancers Panel. The STAT Breast cancer panel offered by [facility] includes sequencing and rearrangement analysis for the following 9 genes:  ATM, BRCA1, BRCA2, CDH1, CHEK2, PALB2, PTEN, STK11 and TP53.  The Common Hereditary Cancers Panel offered by [facility] includes sequencing and/or deletion duplication testing of the following 48 genes: APC, ATM, AXIN2, BARD1, BMPR1A, BRCA1, BRCA2, BRIP1, CDH1, CDKN2A (p14ARF), CDKN2A (p16INK4a), CKD4, CHEK2, CTNNA1, DICER1, EPCAM (Deletion/duplication testing only), GREM1 (promoter region deletion/duplication testing only), KIT, MEN1, MLH1, MSH2, MSH3, MSH6, MUTYH, NBN, NF1, NHTL1, PALB2, PDGFRA, PMS2, POLD1, POLE, PTEN, RAD50, RAD51C, RAD51D, RNF43, SDHB, SDHC, SDHD, SMAD4, SMARCA4. STK11, TP53, TSC1, TSC2, and VHL.  The following genes were evaluated for sequence changes only: SDHA and HOXB13 c.251G>A variant only. The report date is 01/26/2019.    02/10/2019 Cancer Staging    Staging form: Breast, AJCC 8th Edition - Pathologic stage from 02/10/2019: Stage IA (pT1b, pN0, cM0, G2, ER+, PR+, HER2-) - Signed by [name], NP on 02/24/2019    02/10/2019 Surgery   Left lumpectomy [name]) [phone]): IDC, grade 2, 0.6cm, with intermediate grade DCIS, clear margins, 4 left axillary lymph nodes negative    03/02/2019 Oncotype testing   The Oncotype DX score was 13 predicting a risk of outside the breast recurrence over the next 9 years of 4% if the patient's only systemic therapy is tamoxifen for 5 years.    04/06/2019 - 05/03/2019 Radiation Therapy   The patient initially received a dose of 40.05 Gy in 15 fractions to the breast using whole-breast tangent fields. This was delivered using a 3-D conformal technique. The pt received a boost delivering an additional 12 Gy in 6 fractions using a electron boost with 41mV electrons. The total dose was 52.05 Gy.    05/24/2019 -  Anti-estrogen oral therapy   Anastrozole could not tolerate it because of severe hot flashes and joint stiffness at even half the dose, switched to letrozole (she will start at 1/4 tablet daily) on 12/21/2019     CHIEF COMPLIANT: Follow-up of left breast cancer on Tamoxifen  INTERVAL HISTORY: Judith Benningeris a 60y.o. with above-mentioned history of left breast cancer who underwent a lumpectomy, radiation, and is currently on letrozole, as she could not tolerate anastrozole. Mammogram on 11/16/2020 showed no evidence of malignancy. She presents to the clinic today for follow-up.   ALLERGIES:  has No Known Allergies.  MEDICATIONS:  Current Outpatient Medications  Medication Sig Dispense Refill   Biotin 1 MG CAPS Take by mouth.     Cholecalciferol (VITAMIN D3) 50 MCG (2000 UT) capsule Take 2,000  Units by mouth daily.     EPINEPHrine 0.3 mg/0.3 mL IJ SOAJ injection USE AS DIRECTED FOR SEVERE ALLERGIC REACTIONS     HYDROcodone-acetaminophen (NORCO) 5-325 MG tablet 1-2 tabs po q6 hours  prn pain 15 tablet 0   Multiple Vitamin (MULTIVITAMIN) tablet Take 1 tablet by mouth daily.     tamoxifen (NOLVADEX) 10 MG tablet Take 0.5 tablets (5 mg total) by mouth daily. 90 tablet 1   No current facility-administered medications for this visit.    PHYSICAL EXAMINATION: ECOG PERFORMANCE STATUS: {CHL ONC ECOG [phone]}  There were no vitals filed for this visit. There were no vitals filed for this visit.  BREAST:*** No palpable masses or nodules in either right or left breasts. No palpable axillary supraclavicular or infraclavicular adenopathy no breast tenderness or nipple discharge. (exam performed in the presence of a chaperone)  LABORATORY DATA:  I have reviewed the data as listed CMP Latest Ref Rng & Units 08/10/2019 04/30/2019  Glucose 70 - 99 mg/dL 84 92  BUN 6 - 20 mg/dL 15 16  Creatinine 0.44 - 1.00 mg/dL 0.77 0.85  Sodium 135 - 145 mmol/L 141 142  Potassium 3.5 - 5.1 mmol/L 4.1 4.0  Chloride 98 - 111 mmol/L 107 104  CO2 22 - 32 mmol/L 26 29  Calcium 8.9 - 10.3 mg/dL 9.9 9.4  Total Protein 6.5 - 8.1 g/dL 7.4 7.2  Total Bilirubin 0.3 - 1.2 mg/dL 0.8 0.8  Alkaline Phos 38 - 126 U/L 78 78  AST 15 - 41 U/L 19 18  ALT 0 - 44 U/L 19 18    Lab Results  Component Value Date   WBC 5.9 08/10/2019   HGB 12.6 08/10/2019   HCT 37.9 08/10/2019   MCV 93.6 08/10/2019   PLT 184 08/10/2019   NEUTROABS 3.8 08/10/2019    ASSESSMENT & PLAN:  No problem-specific Assessment & Plan notes found for this encounter.    No orders of the defined types were placed in this encounter.  The patient has a good understanding of the overall plan. she agrees with it. she will call with any problems that may develop before the next visit here.  Total time spent: *** mins including face to face time and time spent for planning, charting and coordination of care  Rulon Eisenmenger, MD, MPH 01/16/2021  I, Thana Ates, am acting as scribe for Dr. [name].  {insert scribe  attestation}

## 2021-01-17 ENCOUNTER — Inpatient Hospital Stay: Payer: BC Managed Care – PPO | Attending: Hematology and Oncology | Admitting: Hematology and Oncology

## 2021-01-17 DIAGNOSIS — C50412 Malignant neoplasm of upper-outer quadrant of left female breast: Secondary | ICD-10-CM

## 2021-05-08 ENCOUNTER — Telehealth: Payer: Self-pay | Admitting: Hematology and Oncology

## 2021-05-08 NOTE — Telephone Encounter (Signed)
.  Called pt per 4/4 inbasket , Patient was unavailable, a message with appt time and date was left with number on file.   ?

## 2021-05-23 NOTE — Progress Notes (Signed)
? ?Patient Care Team: ?[name], PA as PCP - General (Family Medicine) ?Rolm Bookbinder, MD as Consulting Physician (General Surgery) ?[name], MD as Consulting Physician (Hematology and Oncology) ?Gery Pray, MD as Consulting Physician (Radiation Oncology) ? ?DIAGNOSIS:  ?Encounter Diagnosis  ?Name Primary?  ? Malignant neoplasm of upper-outer quadrant of left breast in female, estrogen receptor positive ([place])   ? ? ?SUMMARY OF ONCOLOGIC HISTORY: ?Oncology History  ?Malignant neoplasm of upper-outer quadrant of left breast in female, estrogen receptor positive ([place])  ?01/05/2019 Cancer Staging  ? Staging form: Breast, AJCC 8th Edition ?- Clinical stage from 01/05/2019: Stage IA (cT1b, cN0, cM0, G2, ER+, PR+, HER2-) ?  ?01/05/2019 Initial Diagnosis  ? Routine screening mammogram detected a 0.6cm architectural distortion in the left breast. Biopsy showed IDC with DCIS, grade 2, HER-2 - by FISH, ER+ 90%, PR+ 85%, Ki67 15%. ?  ?01/27/2019 Genetic Testing  ? Negative genetic testing. No pathogenic variants identified. VUS in CHEK2 called c.8G>A identified on the [facility] Breast Cancer STAT Panel + Common Hereditary Cancers Panel. The STAT Breast cancer panel offered by [facility] includes sequencing and rearrangement analysis for the following 9 genes:  ATM, BRCA1, BRCA2, CDH1, CHEK2, PALB2, PTEN, STK11 and TP53.  The Common Hereditary Cancers Panel offered by [facility] includes sequencing and/or deletion duplication testing of the following 48 genes: APC, ATM, AXIN2, BARD1, BMPR1A, BRCA1, BRCA2, BRIP1, CDH1, CDKN2A (p14ARF), CDKN2A (p16INK4a), CKD4, CHEK2, CTNNA1, DICER1, EPCAM (Deletion/duplication testing only), GREM1 (promoter region deletion/duplication testing only), KIT, MEN1, MLH1, MSH2, MSH3, MSH6, MUTYH, NBN, NF1, NHTL1, PALB2, PDGFRA, PMS2, POLD1, POLE, PTEN, RAD50, RAD51C, RAD51D, RNF43, SDHB, SDHC, SDHD, SMAD4, SMARCA4. STK11, TP53, TSC1, TSC2, and VHL.  The following genes were evaluated for  sequence changes only: SDHA and HOXB13 c.251G>A variant only. The report date is 01/26/2019.  ?  ?02/10/2019 Cancer Staging  ? Staging form: Breast, AJCC 8th Edition ?- Pathologic stage from 02/10/2019: Stage IA (pT1b, pN0, cM0, G2, ER+, PR+, HER2-) - Signed by [name], NP on 02/24/2019 ? ?  ?02/10/2019 Surgery  ? Left lumpectomy [name]) [phone]): IDC, grade 2, 0.6cm, with intermediate grade DCIS, clear margins, 4 left axillary lymph nodes negative  ?  ?03/02/2019 Oncotype testing  ? The Oncotype DX score was 13 predicting a risk of outside the breast recurrence over the next 9 years of 4% if the patient's only systemic therapy is tamoxifen for 5 years.  ?  ?04/06/2019 - 05/03/2019 Radiation Therapy  ? The patient initially received a dose of 40.05 Gy in 15 fractions to the breast using whole-breast tangent fields. This was delivered using a 3-D conformal technique. The pt received a boost delivering an additional 12 Gy in 6 fractions using a electron boost with 29meV electrons. The total dose was 52.05 Gy. ? ?  ?05/24/2019 -  Anti-estrogen oral therapy  ? Anastrozole could not tolerate it because of severe hot flashes and joint stiffness at even half the dose, switched to letrozole (she will start at 1/4 tablet daily) on 12/21/2019 ?  ? ? ?CHIEF COMPLIANT: Follow-up of left breast cancer on Tamoxifen ? ?INTERVAL HISTORY: Judith Dixon is a 61 y.o. with above-mentioned history of  left breast cancer who underwent a lumpectomy, radiation, and is currently on tamoxifen,  She reports to the clinic today for follow-up. She states that she still have hot flashes and hair loss. She denies joint stiffness. She complain of blisters on the palms of her hands. She state that she gets brain fog.  Sometimes, she forgets things.  Denies any pain lumps or nodules in the breast ? ?ALLERGIES:  has No Known Allergies. ? ?MEDICATIONS:  ?Current Outpatient Medications  ?Medication Sig Dispense Refill  ? Biotin 1 MG  CAPS Take by mouth.    ? Cholecalciferol (VITAMIN D3) 50 MCG (2000 UT) capsule Take 2,000 Units by mouth daily.    ? EPINEPHrine 0.3 mg/0.3 mL IJ SOAJ injection USE AS DIRECTED FOR SEVERE ALLERGIC REACTIONS    ? Multiple Vitamin (MULTIVITAMIN) tablet Take 1 tablet by mouth daily.    ? tamoxifen (NOLVADEX) 10 MG tablet Take 0.5 tablets (5 mg total) by mouth daily. 90 tablet 1  ? ?No current facility-administered medications for this visit.  ? ? ?PHYSICAL EXAMINATION: ?ECOG PERFORMANCE STATUS: 1 - Symptomatic but completely ambulatory ? ?Vitals:  ? 06/06/21 1037  ?BP: 116/78  ?Pulse: 72  ?Resp: 18  ?Temp: (!) 97.2 ?F (36.2 ?C)  ?SpO2: 100%  ? ?Filed Weights  ? 06/06/21 1037  ?Weight: 139 lb 4.8 oz (63.2 kg)  ? ? ?BREAST: No palpable masses or nodules in either right or left breasts. No palpable axillary supraclavicular or infraclavicular adenopathy no breast tenderness or nipple discharge. (exam performed in the presence of a chaperone) ? ?LABORATORY DATA:  ?I have reviewed the data as listed ? ?  Latest Ref Rng & Units 08/10/2019  ?  8:44 AM 04/30/2019  ? 10:37 AM  ?CMP  ?Glucose 70 - 99 mg/dL 84   92    ?BUN 6 - 20 mg/dL 15   16    ?Creatinine 0.44 - 1.00 mg/dL 0.77   0.85    ?Sodium 135 - 145 mmol/L 141   142    ?Potassium 3.5 - 5.1 mmol/L 4.1   4.0    ?Chloride 98 - 111 mmol/L 107   104    ?CO2 22 - 32 mmol/L 26   29    ?Calcium 8.9 - 10.3 mg/dL 9.9   9.4    ?Total Protein 6.5 - 8.1 g/dL 7.4   7.2    ?Total Bilirubin 0.3 - 1.2 mg/dL 0.8   0.8    ?Alkaline Phos 38 - 126 U/L 78   78    ?AST 15 - 41 U/L 19   18    ?ALT 0 - 44 U/L 19   18    ? ? ?Lab Results  ?Component Value Date  ? WBC 5.9 08/10/2019  ? HGB 12.6 08/10/2019  ? HCT 37.9 08/10/2019  ? MCV 93.6 08/10/2019  ? PLT 184 08/10/2019  ? NEUTROABS 3.8 08/10/2019  ? ? ?ASSESSMENT & PLAN:  ?Malignant neoplasm of upper-outer quadrant of left breast in female, estrogen receptor positive ([place]) ?02/10/2019:Left lumpectomy [name]): IDC, grade 2, 0.6cm, with  intermediate grade DCIS, clear margins, 4 left axillary lymph nodes negative T1BN0 stage Ia ?Oncotype DX score 13: Low risk: 4%Risk of distant recurrence ?  ?Treatment plan: ?1.  Adjuvant radiation completed 05/03/2019 ?2.  Adjuvant antiestrogen therapy with anastrozole 1 mg daily x5 years started 05/24/2019 discontinued September 2021.  She could not tolerate half a dose of anastrozole.  Switched to letrozole 1/4 tablet daily 12/21/2019, switched to tamoxifen 5 mg daily 09/18/2020 (plan to treat her till April 2026) ?--------------------------------------------------------------------------------------- ?Tamoxifen toxicities:  ?Hot flashes: Still present but better ?Joint stiffness has resolved ?3.  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  ?Trigger fingers and  trigger thumb: Status post surgery.   Resolved ?  ?Breast cancer surveillance: ?Mammogram 11/16/2020: At [facility]: Benign breast density category C    ?Breast exam 06/06/2021: Benign ?   ?Return to clinic in 1 year for follow-up. ? ? ? ?No orders of the defined types were placed in this encounter. ? ?The patient has a good understanding of the overall plan. she agrees with it. she will call with any problems that may develop before the next visit here. ?Total time spent: 30 mins including face to face time and time spent for planning, charting and co-ordination of care ? ? Harriette Ohara, MD ?06/06/21 ? ? ? I Gardiner Coins am scribing for Dr. [name] ? ?I have reviewed the above documentation for accuracy and completeness, and I agree with the above. ?  ?

## 2021-06-06 ENCOUNTER — Other Ambulatory Visit: Payer: Self-pay

## 2021-06-06 ENCOUNTER — Ambulatory Visit: Payer: BC Managed Care – PPO | Attending: General Surgery | Admitting: Rehabilitation

## 2021-06-06 ENCOUNTER — Inpatient Hospital Stay: Payer: BC Managed Care – PPO | Attending: Hematology and Oncology | Admitting: Hematology and Oncology

## 2021-06-06 ENCOUNTER — Encounter: Payer: Self-pay | Admitting: Rehabilitation

## 2021-06-06 DIAGNOSIS — M79662 Pain in left lower leg: Secondary | ICD-10-CM | POA: Diagnosis not present

## 2021-06-06 DIAGNOSIS — Z923 Personal history of irradiation: Secondary | ICD-10-CM | POA: Diagnosis not present

## 2021-06-06 DIAGNOSIS — Z79811 Long term (current) use of aromatase inhibitors: Secondary | ICD-10-CM | POA: Diagnosis not present

## 2021-06-06 DIAGNOSIS — M79622 Pain in left upper arm: Secondary | ICD-10-CM

## 2021-06-06 DIAGNOSIS — C50412 Malignant neoplasm of upper-outer quadrant of left female breast: Secondary | ICD-10-CM | POA: Insufficient documentation

## 2021-06-06 DIAGNOSIS — I890 Lymphedema, not elsewhere classified: Secondary | ICD-10-CM | POA: Diagnosis present

## 2021-06-06 DIAGNOSIS — Z170 Estrogen receptor positive status [ER+]: Secondary | ICD-10-CM | POA: Diagnosis not present

## 2021-06-06 DIAGNOSIS — R232 Flushing: Secondary | ICD-10-CM | POA: Insufficient documentation

## 2021-06-06 DIAGNOSIS — Z79899 Other long term (current) drug therapy: Secondary | ICD-10-CM | POA: Diagnosis not present

## 2021-06-06 DIAGNOSIS — M25612 Stiffness of left shoulder, not elsewhere classified: Secondary | ICD-10-CM | POA: Insufficient documentation

## 2021-06-06 NOTE — Therapy (Deleted)
.  oprc 

## 2021-06-06 NOTE — Assessment & Plan Note (Addendum)
02/10/2019:Left lumpectomy Judith Dixon): IDC, grade 2, 0.6cm, with intermediate grade DCIS, clear margins, 4 left axillary lymph nodes negative?T1BN0 stage Ia ?Oncotype DX score 13: Low risk: 4%Risk of distant recurrence ?? ?Treatment plan: ?1.??Adjuvant radiation completed 05/03/2019 ?2.??Adjuvant antiestrogen therapy with anastrozole 1 mg daily x5 years?started 05/24/2019 discontinued September 2021. ?She could not tolerate half a dose of anastrozole. ?Switched to letrozole 1/4 tablet daily 12/21/2019, switched to tamoxifen 5 mg daily 09/18/2020 (plan to treat her till April 2026) ?--------------------------------------------------------------------------------------- ?Tamoxifen toxicities:? ?1. Hot flashes: Still present but better ?2. Joint stiffness has resolved ?3.  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 ?Trigger fingers and trigger thumb: Status post surgery. ? Resolved ?? ?Breast cancer surveillance: ?Mammogram 11/16/2020: At [facility]: Benign breast density category C    ?Breast exam 06/06/2021: Benign ? ? ?Return to clinic in 1 year for follow-up. ?

## 2021-06-06 NOTE — Therapy (Signed)
?OUTPATIENT PHYSICAL THERAPY ONCOLOGY EVALUATION ? ?Patient Name: Judith Dixon ?MRN: [id number] ?DOB:06/03/1960, 61 y.o., female ?Today's Date: 06/06/2021 ? ? PT End of Session - 06/06/21 1751   ? ? Visit Number 1   ? Number of Visits 2   ? Date for PT Re-Evaluation 07/04/21   ? PT Start Time 0900   ? PT Stop Time [phone]   ? PT Time Calculation (min) 42 min   ? Activity Tolerance Patient tolerated treatment well   ? Behavior During Therapy [facility] for tasks assessed/performed   ? ?  ?  ? ?  ? ? ?Past Medical History:  ?Diagnosis Date  ? Allergy   ? Breast cancer ([place])   ? left breast cancer diagnosed 01-05-2019- surgery 02-10-2019  ? Complication of anesthesia   ? hard time waking up  ? Family history of breast cancer   ? Family history of colon cancer   ? Family history of prostate cancer   ? Motion sickness   ? ?Past Surgical History:  ?Procedure Laterality Date  ? BREAST LUMPECTOMY WITH RADIOACTIVE SEED AND SENTINEL LYMPH NODE BIOPSY Left 02/10/2019  ? Procedure: LEFT BREAST LUMPECTOMY WITH RADIOACTIVE SEED AND LEFT AXILLARY SENTINEL LYMPH NODE BIOPSY;  Surgeon: Rolm Bookbinder, MD;  Location: [place];  Service: General;  Laterality: Left;  ? ENDOMETRIAL ABLATION    ? TRIGGER FINGER RELEASE Right 09/08/2020  ? Procedure: RELEASE TRIGGER FINGER/A-1 PULLEY RIGHT THUMB;  Surgeon: Leanora Cover, MD;  Location: [place];  Service: Orthopedics;  Laterality: Right;  30 MIN  ? WISDOM TOOTH EXTRACTION    ? age 56-20  ? WRIST SURGERY    ? ?Patient Active Problem List  ? Diagnosis Date Noted  ? Genetic testing 01/27/2019  ? Family history of breast cancer   ? Family history of prostate cancer   ? Family history of colon cancer   ? Malignant neoplasm of upper-outer quadrant of left breast in female, estrogen receptor positive ([place]) 01/13/2019  ? Trigger finger of left thumb 03/03/2018  ? Trigger middle finger of left hand 03/03/2018  ? ? ?PCP: Marilynne Drivers, PA ? ?REFERRING PROVIDER: Rolm Bookbinder,  MD  ? ?REFERRING DIAG: C50.412 (ICD-10-CM) - Malignant neoplasm of upper-outer quadrant of left female breast Z17.0 (ICD-10-CM) - Estrogen receptor positive status (ER+)  ? ?THERAPY DIAG:  ?Lymphedema, not elsewhere classified ? ?Pain in left upper arm ? ?Stiffness of left shoulder, not elsewhere classified ? ?ONSET DATE: 02/10/19 ? ?SUBJECTIVE                                                                                                                                                                                          ? ?  SUBJECTIVE STATEMENT: I have been having pain in the front of the shoulder like the muscle is slipping out of the groove when I wear the sleeve.   ? ?PERTINENT HISTORY:  L breast cancer s/p L lumpectomy and SLNB (4 nodes all negative) on 02/10/19, she has finished radiation  ? ?PAIN:  ?Are you having pain? No ?When I wear the sleeve I feel like the (biceps tendon area) gets sore and like I cant move my arm.  ? ?PRECAUTIONS: L UE lymphedema risk ? ?WEIGHT BEARING RESTRICTIONS No ? ?FALLS:  ?Has patient fallen in last 6 months? No ? ?LIVING ENVIRONMENT: ?Lives with: lives with their family and lives with their spouse ?Lives in: House/apartment ? ?OCCUPATION: dental hygenist instructor ? ?LEISURE: yoga, getting back to exercising with husband ? ?HAND DOMINANCE : right  ? ?PRIOR LEVEL OF FUNCTION: Independent ? ?PATIENT GOALS any recommendations for the sleeve ? ? ?OBJECTIVE ? ?COGNITION: ? Overall cognitive status: Within functional limits for tasks assessed  ? ?PALPATION: No edema or pitting ? ?POSTURE: rounded shoulders ? ? ?LYMPHEDEMA ASSESSMENTS:  ? ?[place] RIGHT  06/06/2021  ?15 cm proximal to olecranon    ?10 cm proximal to olecranon process   ?Olecranon process   ?15cm proximal to olecranon   ?10 cm proximal to ulnar styloid process   ?Just proximal to ulnar styloid process   ?Across hand at thumb web space   ?At base of 2nd digit   ?(Blank rows = not tested) ? ?[place] LEFT  06/06/2021   ?15cm proximal to olecranon 29  ?10 cm proximal to olecranon process   ?Olecranon process 24.3  ?15 cm proximal to ulnar styloid   ?10 cm proximal to ulnar styloid process 21  ?Just proximal to ulnar styloid process 15.5  ?Across hand at thumb web space 18.2  ?At base of 2nd digit 6.2  ?(Blank rows = not tested) ? ?MMT:   R  L ?Flexion  5  5 ?Abduction 5  5 ?ER  5  4 ?IR  5  5  ?Biceps   5  5 ? ?AROM shoulder: All WNL but posterior pain left shoulder with all ? ?TODAY'S TREATMENT  ?06/06/21 ?Pt did not have current sleeves but thinks they are a juzo soft size I after discussion.  Reviewed how pt is using her sleeve which was every day and she was told to pull the top up over the shoulder which then falls back down and she pulls it up over and over - thinking this may be a cause of some of the pain.  Discussed correct stopping point. ?Measured pt who fits in a Juzo soft size 1 or 2 max so pt was given this information to check her sizing at home ?Pt will wear sleeve again for 3-4 days and see if the pain is improved with correct wear. ?Also gave pt some stretches and strengthening to start for her mild impingment type posterior pain with end range per below.  ? ? ?PATIENT EDUCATION:  ?Education details: per above ?Person educated: Patient ?Education method: Explanation, Demonstration, Tactile cues, Verbal cues, and Handouts ?Education comprehension: verbalized understanding and returned demonstration ? ? Access Code: [email or web address] ?URL: [email or web address] ?Date: 06/06/2021 ?Prepared by: Shan Levans ? ?Exercises ?- Seated Shoulder W External Rotation on [employer] - 1 x daily - 4 x weekly - 1-3 sets - 10 reps - no hold ?- Seated Shoulder Horizontal Abduction with Resistance - 1 x daily - 4 x weekly - 1-3 sets -  10 reps - no hold ?- Seated Shoulder Diagonal with Resistance - 1 x daily - 4 x weekly - 1-3 sets - 10 reps - no hold ?- Single Arm Doorway Pec Stretch at 90 Degrees Abduction - 1-2 x daily - 7 x  weekly - 3 reps - 60 second hold ?- Cat Cow to Child's Pose - 1 x daily - 7 x weekly - 1-3 sets - 10 reps - no hold ?- Child's Pose with Sidebending - 1 x daily - 7 x weekly - 1-3 sets - 10 reps - 20-30 seconds hold ? ?ASSESSMENT: ? ?CLINICAL IMPRESSION: ?Patient is a 61 y.o. female who was seen today for physical therapy evaluation and treatment for her left shoulder pain when wearing her sleeve. 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  Pt was currently wearing the sleeve incorrectly up on top of the shoulder so she will modify this to see if it stops the pain.  Also pt will let PT know via email the current sizing and if the pain does not resolve with correct wear.  Also reminded pt of seated scap exercises and stretches which she had done her previously but not performed for a long time.    ? ? ?OBJECTIVE IMPAIRMENTS decreased knowledge of use of DME.  ? ?ACTIVITY LIMITATIONS none ? ?PERSONAL FACTORS None  ? ?REHAB POTENTIAL: Excellent ? ?CLINICAL DECISION MAKING: Stable/uncomplicated ? ?EVALUATION COMPLEXITY: Low ? ?GOALS: ?Goals reviewed with patient? Yes ? ?SHORT TERM GOALS: Target date: 06/20/2021 ? ?Pt will experience no biceps region pain with wearing her compression sleeve ?Baseline: ?Goal status:  ? ?2.  Pt will be ind with HEP ?Baseline:  ?Goal status: MET ?PLAN: ?PT FREQUENCY: 2 visits ? ?PT DURATION: 4 weeks ? ?PLANNED INTERVENTIONS: Therapeutic exercises and Patient/Family education ? ?PLAN FOR NEXT SESSION: how was sleeve? Need anything new ordered?  ? ? ?Stark Bray, PT ?06/06/2021, 9:43 AM ? ?

## 2021-08-01 ENCOUNTER — Ambulatory Visit: Payer: BC Managed Care – PPO | Admitting: Mental Health

## 2021-08-01 DIAGNOSIS — F4322 Adjustment disorder with anxiety: Secondary | ICD-10-CM | POA: Diagnosis not present

## 2021-08-01 NOTE — Progress Notes (Signed)
[facility] Counselor Initial Adult Exam  Name: Judith Dixon Date: 08/01/2021 MRN: [id number] DOB: 1960-11-14 PCP: [name], PA  Time spent:  76 minutes  Reason for Visit /Presenting Problem: patient reports having a panic attack at work now 2x within a week- June 6th and then on the 12th. Patient has a strong work ethic rarely misses work.  Now going to work she has chest pressure, shortness of breath increasingly.  Her PCP took her out of work on [facility] for the past week and a half, continues to provide support to students, has a video call w/ them later today. 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  She also expressed ethical concerns regarding the new director based on other actions taken. Recovery from breast cancer in 2021. Struggles w/ some "brain fog" due to one of her medications. Strained relationship w/ mother in law; husband does as well.  Married 37 years.  2 children- age 32- daughter; son-age 22. Her parents live in [place], has a sister in [place].  2016 moved to this area w/ family. 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 Earned MS in dental hygiene prior to the move, her husband was able to move to this area in 2017, a year later.  She stated that her daughter lives in [place] and her son is soon to move to this area from their home town of [place], [place].  She reports she is looking for alternative employment, recently interviewed for a dental hygiene position and is to hear back hopefully tomorrow with a job offer.   Mental Status Exam:    Appearance:    Casual     Behavior:   Appropriate  Motor:   WNL  Speech/Language:    Clear and Coherent  Affect:   Full range   Mood:   Euthymic, pleasant  Thought process:   Logical, linear, goal directed  Thought content:     WNL  Sensory/Perceptual disturbances:      none  Orientation:   x4  Attention:   Good  Concentration:   Good  Memory:   Intact  Fund of knowledge:    Consistent with age and development  Insight:     Good  Judgment:    Good  Impulse Control:   Good     Reported Symptoms: Anxiety, rumination, panic attacks  Risk Assessment: Danger to Self:  No Self-injurious Behavior: No Danger to Others: No Duty to Warn:no Physical Aggression / Violence:No  Access to Firearms a concern: No  Gang Involvement:No  Patient / guardian was educated about steps to take if suicide or homicide risk level increases between visits: yes While future psychiatric events cannot be accurately predicted, the patient does not currently require acute inpatient psychiatric care and does not currently meet [facility] involuntary commitment criteria.  Substance Abuse History: Current substance abuse: No     Past Psychiatric History:   No previous psychological problems have been observed Outpatient Providers: none History of Psych Hospitalization: No  Psychological Testing:  none   Family History:  Family History  Problem Relation Age of Onset   Colon cancer Maternal Grandmother 45   Colon polyps Mother    Thyroid disease Mother    Hypertension Mother    Colon polyps Father    Prostate cancer Father    Arthritis Father    Hypertension Father  Breast cancer Maternal Aunt        dx 62s   Breast cancer Cousin 24       maternal first cousin   Aneurysm Paternal Uncle    Skin cancer Maternal Grandfather    Leukemia Paternal Grandmother    Esophageal cancer Neg Hx    Rectal cancer Neg Hx    Stomach cancer Neg Hx     Living situation: the patient lives with their family  Sexual Orientation:  Straight  Relationship Status: married              If a parent, number of children / ages: 33 -ages 96 and 61  [place]; spouse  Museum/gallery curator Stress:  No   Income/Employment/Disability: Employment  Armed forces logistics/support/administrative officer: No   Educational  History: Education:  Masters degree in Human resources officer  Stressors: work related  Strengths:  Supportive Relationships, Family, and Friends  Barriers:  none   Legal History: Pending legal issue / charges: none History of legal issue / charges:  none  Medical History/Surgical History: Past Medical History:  Diagnosis Date   Allergy    Breast cancer ([place])    left breast cancer diagnosed 01-05-2019- surgery 02-09-1759   Complication of anesthesia    hard time waking up   Family history of breast cancer    Family history of colon cancer    Family history of prostate cancer    Motion sickness     Past Surgical History:  Procedure Laterality Date   BREAST LUMPECTOMY WITH RADIOACTIVE SEED AND SENTINEL LYMPH NODE BIOPSY Left 02/10/2019   Procedure: LEFT BREAST LUMPECTOMY WITH RADIOACTIVE SEED AND LEFT AXILLARY SENTINEL LYMPH NODE BIOPSY;  Surgeon: Rolm Bookbinder, MD;  Location: [place];  Service: General;  Laterality: Left;   ENDOMETRIAL ABLATION     TRIGGER FINGER RELEASE Right 09/08/2020   Procedure: RELEASE TRIGGER FINGER/A-1 PULLEY RIGHT THUMB;  Surgeon: Leanora Cover, MD;  Location: [place];  Service: Orthopedics;  Laterality: Right;  30 MIN   WISDOM TOOTH EXTRACTION     age [age]   WRIST SURGERY      Medications: Current Outpatient Medications  Medication Sig Dispense Refill   Biotin 1 MG CAPS Take by mouth.     Cholecalciferol (VITAMIN D3) 50 MCG (2000 UT) capsule Take 2,000 Units by mouth daily.     EPINEPHrine 0.3 mg/0.3 mL IJ SOAJ injection USE AS DIRECTED FOR SEVERE ALLERGIC REACTIONS     Multiple Vitamin (MULTIVITAMIN) tablet Take 1 tablet by mouth daily.     tamoxifen (NOLVADEX) 10 MG tablet Take 0.5 tablets (5 mg total) by mouth daily. 90 tablet 1   No current facility-administered medications for this visit.    No Known Allergies  Diagnoses:    ICD-10-CM   1. Adjustment disorder with anxious mood  F43.22       Plan of  Care: TBD   Anson [place], [facility]

## 2021-08-24 ENCOUNTER — Ambulatory Visit: Payer: BC Managed Care – PPO | Admitting: Mental Health

## 2021-08-24 DIAGNOSIS — F4322 Adjustment disorder with anxiety: Secondary | ICD-10-CM

## 2021-08-24 NOTE — Progress Notes (Addendum)
[facility] psychotherapy note  Name: Judith Dixon Date: 08/24/21 MRN: [id number] DOB: Jan 13, 1961 PCP: [name], PA  Time spent:  54 minutes  Treatment:   individual therapy  Mental Status Exam:    Appearance:    Casual     Behavior:   Appropriate  Motor:   WNL  Speech/Language:    Clear and Coherent  Affect:   Full range   Mood:   Euthymic, pleasant  Thought process:   Logical, linear, goal directed  Thought content:     WNL  Sensory/Perceptual disturbances:     none  Orientation:   x4  Attention:   Good  Concentration:   Good  Memory:   Intact  Fund of knowledge:    Consistent with age and development  Insight:     Good  Judgment:    Good  Impulse Control:   Good     Reported Symptoms: Anxiety, rumination, panic attacks  Risk Assessment: Danger to Self:  No Self-injurious Behavior: No Danger to Others: No Duty to Warn:no Physical Aggression / Violence:No  Access to Firearms a concern: No  Gang Involvement:No  Patient / guardian was educated about steps to take if suicide or homicide risk level increases between visits: yes While future psychiatric events cannot be accurately predicted, the patient does not currently require acute inpatient psychiatric care and does not currently meet [facility] involuntary commitment criteria.  Family History:  Family History  Problem Relation Age of Onset   Colon cancer Maternal Grandmother 74   Colon polyps Mother    Thyroid disease Mother    Hypertension Mother    Colon polyps Father    Prostate cancer Father    Arthritis Father    Hypertension Father    Breast cancer Maternal Aunt        dx 74s   Breast cancer Cousin 59       maternal first cousin   Aneurysm Paternal Uncle    Skin cancer Maternal Grandfather    Leukemia Paternal Grandmother    Esophageal cancer Neg Hx    Rectal cancer Neg Hx    Stomach cancer Neg Hx      Medical History/Surgical History: Past Medical History:  Diagnosis Date    Allergy    Breast cancer ([place])    left breast cancer diagnosed 01-05-2019- surgery 03-11-3662   Complication of anesthesia    hard time waking up   Family history of breast cancer    Family history of colon cancer    Family history of prostate cancer    Motion sickness     Past Surgical History:  Procedure Laterality Date   BREAST LUMPECTOMY WITH RADIOACTIVE SEED AND SENTINEL LYMPH NODE BIOPSY Left 02/10/2019   Procedure: LEFT BREAST LUMPECTOMY WITH RADIOACTIVE SEED AND LEFT AXILLARY SENTINEL LYMPH NODE BIOPSY;  Surgeon: Rolm Bookbinder, MD;  Location: [place];  Service: General;  Laterality: Left;   ENDOMETRIAL ABLATION     TRIGGER FINGER RELEASE Right 09/08/2020   Procedure: RELEASE TRIGGER FINGER/A-1 PULLEY RIGHT THUMB;  Surgeon: Leanora Cover, MD;  Location: [place];  Service: Orthopedics;  Laterality: Right;  30 MIN   WISDOM TOOTH EXTRACTION     age 42-20   WRIST SURGERY      Medications: Current Outpatient Medications  Medication Sig Dispense Refill   Biotin 1 MG CAPS Take by mouth.     Cholecalciferol (VITAMIN D3) 50 MCG (2000 UT) capsule Take 2,000 Units by mouth daily.  EPINEPHrine 0.3 mg/0.3 mL IJ SOAJ injection USE AS DIRECTED FOR SEVERE ALLERGIC REACTIONS     Multiple Vitamin (MULTIVITAMIN) tablet Take 1 tablet by mouth daily.     tamoxifen (NOLVADEX) 10 MG tablet TAKE 1/2 TABLET BY MOUTH DAILY 45 tablet 3   No current facility-administered medications for this visit.    Subjective:  Patient presents for session on time.  Continue to assess needs as well as recent events.  Patient continues to be out on FMLA from work.  Most of the session was centered around past work experiences leading up to her being on leave.  Through guided discovery, she identified the challenge of deciding whether she should return to her job or to make a change due to the ethical conflict she has with with how the program with which she was involved is being  currently managed by the new director.  Facilitated her identifying thoughts toward making a decision for herself while providing support and understanding.  She has support from her husband and friends.  Ways to cope and care for herself during this time were encouraged.  She plans to maintain a boundary with work, how much time she allows herself to think about the situation which can cause some emotional distress.  Discussed thought blocking.  Diagnoses:    ICD-10-CM   1. Adjustment disorder with anxious mood  F43.22        Plan: Patient is to use CBT, mindfulness and coping skills to help manage / decrease symptoms associated with their diagnosis.   Long-term goals:   Maintain symptom reduction: The patient will report sustained reduction in symptoms of anxiety using coping skills and interventions for 3 consecutive months progressively.      Short-term goal:  The patient will learn and apply CBT and mindfulness-based coping skills for managing anxiety and practice using it between sessions.       2.   The patient will CBT and mindfulness-based interventions to increase awareness of negative thought patterns. 3.   The patient will engage in pleasurable activities for stress reduction.       Anson Oregon, [facility]

## 2021-09-03 ENCOUNTER — Other Ambulatory Visit: Payer: Self-pay | Admitting: Hematology and Oncology

## 2021-09-06 ENCOUNTER — Ambulatory Visit: Payer: BC Managed Care – PPO | Admitting: Mental Health

## 2021-09-06 DIAGNOSIS — F4322 Adjustment disorder with anxiety: Secondary | ICD-10-CM

## 2021-09-06 NOTE — Progress Notes (Addendum)
[facility] psychotherapy note  Name: Judith Dixon Date: 09/06/21 MRN: [id number] DOB: 1960/10/21 PCP: [name], PA  Time spent:  53 minutes  Treatment:   individual therapy  Mental Status Exam:    Appearance:    Casual     Behavior:   Appropriate  Motor:   WNL  Speech/Language:    Clear and Coherent  Affect:   Full range   Mood:   Euthymic, pleasant  Thought process:   Logical, linear, goal directed  Thought content:     WNL  Sensory/Perceptual disturbances:     none  Orientation:   x4  Attention:   Good  Concentration:   Good  Memory:   Intact  Fund of knowledge:    Consistent with age and development  Insight:     Good  Judgment:    Good  Impulse Control:   Good     Reported Symptoms: Anxiety, rumination, panic attacks  Risk Assessment: Danger to Self:  No Self-injurious Behavior: No Danger to Others: No Duty to Warn:no Physical Aggression / Violence:No  Access to Firearms a concern: No  Gang Involvement:No  Patient / guardian was educated about steps to take if suicide or homicide risk level increases between visits: yes While future psychiatric events cannot be accurately predicted, the patient does not currently require acute inpatient psychiatric care and does not currently meet [facility] involuntary commitment criteria.  Family History:  Family History  Problem Relation Age of Onset   Colon cancer Maternal Grandmother 47   Colon polyps Mother    Thyroid disease Mother    Hypertension Mother    Colon polyps Father    Prostate cancer Father    Arthritis Father    Hypertension Father    Breast cancer Maternal Aunt        dx 58s   Breast cancer Cousin 14       maternal first cousin   Aneurysm Paternal Uncle    Skin cancer Maternal Grandfather    Leukemia Paternal Grandmother    Esophageal cancer Neg Hx    Rectal cancer Neg Hx    Stomach cancer Neg Hx      Medical History/Surgical History: Past Medical History:  Diagnosis Date    Allergy    Breast cancer ([place])    left breast cancer diagnosed 01-05-2019- surgery 07-12-1243   Complication of anesthesia    hard time waking up   Family history of breast cancer    Family history of colon cancer    Family history of prostate cancer    Motion sickness     Past Surgical History:  Procedure Laterality Date   BREAST LUMPECTOMY WITH RADIOACTIVE SEED AND SENTINEL LYMPH NODE BIOPSY Left 02/10/2019   Procedure: LEFT BREAST LUMPECTOMY WITH RADIOACTIVE SEED AND LEFT AXILLARY SENTINEL LYMPH NODE BIOPSY;  Surgeon: Rolm Bookbinder, MD;  Location: [place];  Service: General;  Laterality: Left;   ENDOMETRIAL ABLATION     TRIGGER FINGER RELEASE Right 09/08/2020   Procedure: RELEASE TRIGGER FINGER/A-1 PULLEY RIGHT THUMB;  Surgeon: Leanora Cover, MD;  Location: [place];  Service: Orthopedics;  Laterality: Right;  30 MIN   WISDOM TOOTH EXTRACTION     age [age]   WRIST SURGERY      Medications: Current Outpatient Medications  Medication Sig Dispense Refill   Biotin 1 MG CAPS Take by mouth.     Cholecalciferol (VITAMIN D3) 50 MCG (2000 UT) capsule Take 2,000 Units by mouth daily.  EPINEPHrine 0.3 mg/0.3 mL IJ SOAJ injection USE AS DIRECTED FOR SEVERE ALLERGIC REACTIONS     Multiple Vitamin (MULTIVITAMIN) tablet Take 1 tablet by mouth daily.     tamoxifen (NOLVADEX) 10 MG tablet TAKE 1/2 TABLET BY MOUTH DAILY 45 tablet 3   No current facility-administered medications for this visit.    Subjective:  Patient presents for session on time.  Patient shared efforts to secure employment where she had an interview recently.  She stated she can work part-time at a nearby [employer].  She stated her working part-time will be a challenge for her and her husband financially however, at this point feels is the best that based on the experience that she had at her previous employer. Facilitated her identifying and sharing experiences, subsequent  thoughts and feelings related. Continue to explore with patient ways to cope and care for herself during this time were encouraged.  She plans to maintain a boundary with when to work on seeking and securing employment and allowing herself to destress and relax.  Reviewed thought blocking.  Diagnoses:    ICD-10-CM   1. Adjustment disorder with anxious mood  F43.22         Plan: Patient is to use CBT, mindfulness and coping skills to help manage / decrease symptoms associated with their diagnosis.   Long-term goals:   Maintain symptom reduction: The patient will report sustained reduction in symptoms of anxiety using coping skills and interventions for 3 consecutive months progressively.      Short-term goal:  The patient will learn and apply CBT and mindfulness-based coping skills for managing anxiety and practice using it between sessions.       2.   The patient will CBT and mindfulness-based interventions to increase awareness of negative thought patterns. 3.   The patient will engage in pleasurable activities for stress reduction.       Anson [place], [facility]

## 2021-10-02 ENCOUNTER — Ambulatory Visit: Payer: BC Managed Care – PPO | Admitting: Mental Health

## 2021-10-16 ENCOUNTER — Ambulatory Visit: Payer: BC Managed Care – PPO | Admitting: Mental Health

## 2021-10-29 ENCOUNTER — Ambulatory Visit: Payer: BC Managed Care – PPO | Admitting: Mental Health

## 2021-10-29 DIAGNOSIS — F4322 Adjustment disorder with anxiety: Secondary | ICD-10-CM | POA: Diagnosis not present

## 2021-10-29 NOTE — Progress Notes (Signed)
[facility]  Name: Judith [place] Date: 10/29/21 MRN: [id number] DOB: 1961-01-27 PCP: [name], PA  Time spent:  54 minutes  Treatment:   individual therapy  Mental Status Exam:    Appearance:    Casual     Behavior:   Appropriate  Motor:   WNL  Speech/Language:    Clear and Coherent  Affect:   Full range   Mood:   Euthymic, pleasant  Thought process:   Logical, linear, goal directed  Thought content:     WNL  Sensory/Perceptual disturbances:     none  Orientation:   x4  Attention:   Good  Concentration:   Good  Memory:   Intact  Fund of knowledge:    Consistent with age and development  Insight:     Good  Judgment:    Good  Impulse Control:   Good     Reported Symptoms: Anxiety, rumination, panic attacks  Risk Assessment: Danger to Self:  No Self-injurious Behavior: No Danger to Others: No Duty to Warn:no Physical Aggression / Violence:No  Access to Firearms a concern: No  Gang Involvement:No  Patient / guardian was educated about steps to take if suicide or homicide risk level increases between visits: yes While future psychiatric events cannot be accurately predicted, the patient does not currently require acute inpatient psychiatric care and does not currently meet [facility] involuntary commitment criteria.    Medical History/Surgical History: Past Medical History:  Diagnosis Date   Allergy    Breast cancer ([place])    left breast cancer diagnosed 01-05-2019- surgery 05-11-9627   Complication of anesthesia    hard time waking up   Family history of breast cancer    Family history of colon cancer    Family history of prostate cancer    Motion sickness     Past Surgical History:  Procedure Laterality Date   BREAST LUMPECTOMY WITH RADIOACTIVE SEED AND SENTINEL LYMPH NODE BIOPSY Left 02/10/2019   Procedure: LEFT BREAST LUMPECTOMY WITH RADIOACTIVE SEED AND LEFT AXILLARY SENTINEL LYMPH NODE BIOPSY;  Surgeon: Rolm Bookbinder, MD;   Location: [place];  Service: General;  Laterality: Left;   ENDOMETRIAL ABLATION     TRIGGER FINGER RELEASE Right 09/08/2020   Procedure: RELEASE TRIGGER FINGER/A-1 PULLEY RIGHT THUMB;  Surgeon: Leanora Cover, MD;  Location: [place];  Service: Orthopedics;  Laterality: Right;  30 MIN   WISDOM TOOTH EXTRACTION     age [age]   WRIST SURGERY      Medications: Current Outpatient Medications  Medication Sig Dispense Refill   Biotin 1 MG CAPS Take by mouth.     Cholecalciferol (VITAMIN D3) 50 MCG (2000 UT) capsule Take 2,000 Units by mouth daily.     EPINEPHrine 0.3 mg/0.3 mL IJ SOAJ injection USE AS DIRECTED FOR SEVERE ALLERGIC REACTIONS     Multiple Vitamin (MULTIVITAMIN) tablet Take 1 tablet by mouth daily.     tamoxifen (NOLVADEX) 10 MG tablet TAKE 1/2 TABLET BY MOUTH DAILY 45 tablet 3   No current facility-administered medications for this visit.    Subjective:  Patient presents for session on time.  Assessed progress and events since last visit which was about 1 month ago.  She shared experiences starting her new job, which is part-time.  She continues to share financial stress that she and her husband are trying to manage.  She stated her husband has had some ongoing medical issues which also has been adding to their stress.  She questions leaving her last job due to the ongoing financial stress however, patient was encouraged to recall the ethical dilemma with which she was coping at that time; she plans to remind herself.  Ways to try and keep her stress manageable was explored.  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  Interventions: CBT, supportive therapy, motivational interviewing  Diagnoses:    ICD-10-CM   1. Adjustment disorder with anxious mood  F43.22          Plan: Patient is to use CBT, mindfulness and coping skills to help manage / decrease symptoms associated with their diagnosis.   Long-term goals:   Maintain symptom reduction: The patient will report sustained reduction in symptoms of anxiety using coping skills and interventions for 3 consecutive months progressively.      Short-term goal:  The patient will learn and apply CBT and mindfulness-based coping skills for managing anxiety and practice using it between sessions.       2.   The patient will CBT and mindfulness-based interventions to increase awareness of negative thought patterns. 3.   The patient will engage in pleasurable activities for stress reduction.             Anson [place], [facility]

## 2021-11-06 ENCOUNTER — Ambulatory Visit: Payer: BC Managed Care – PPO | Admitting: Mental Health

## 2021-11-14 ENCOUNTER — Ambulatory Visit: Payer: BC Managed Care – PPO | Admitting: Mental Health

## 2021-11-28 ENCOUNTER — Ambulatory Visit: Payer: BC Managed Care – PPO | Admitting: Mental Health

## 2021-11-28 DIAGNOSIS — F4322 Adjustment disorder with anxiety: Secondary | ICD-10-CM | POA: Diagnosis not present

## 2021-11-28 NOTE — Progress Notes (Signed)
[facility]  Name: Navika Hoopes Date:11/28/21 MRN: [id number] DOB: October 17, 1960 PCP: [name], PA  Time spent:  54 minutes  Treatment:   individual therapy  Mental Status Exam:    Appearance:    Casual     Behavior:   Appropriate  Motor:   WNL  Speech/Language:    Clear and Coherent  Affect:   Full range   Mood:   Euthymic, pleasant  Thought process:   Logical, linear, goal directed  Thought content:     WNL  Sensory/Perceptual disturbances:     none  Orientation:   x4  Attention:   Good  Concentration:   Good  Memory:   Intact  Fund of knowledge:    Consistent with age and development  Insight:     Good  Judgment:    Good  Impulse Control:   Good     Reported Symptoms: Anxiety, rumination, panic attacks  Risk Assessment: Danger to Self:  No Self-injurious Behavior: No Danger to Others: No Duty to Warn:no Physical Aggression / Violence:No  Access to Firearms a concern: No  Gang Involvement:No  Patient / guardian was educated about steps to take if suicide or homicide risk level increases between visits: yes While future psychiatric events cannot be accurately predicted, the patient does not currently require acute inpatient psychiatric care and does not currently meet [facility] involuntary commitment criteria.    Medical History/Surgical History: Past Medical History:  Diagnosis Date   Allergy    Breast cancer ([place])    left breast cancer diagnosed 01-05-2019- surgery 10-07-2353   Complication of anesthesia    hard time waking up   Family history of breast cancer    Family history of colon cancer    Family history of prostate cancer    Motion sickness     Past Surgical History:  Procedure Laterality Date   BREAST LUMPECTOMY WITH RADIOACTIVE SEED AND SENTINEL LYMPH NODE BIOPSY Left 02/10/2019   Procedure: LEFT BREAST LUMPECTOMY WITH RADIOACTIVE SEED AND LEFT AXILLARY SENTINEL LYMPH NODE BIOPSY;  Surgeon: Rolm Bookbinder, MD;   Location: [place];  Service: General;  Laterality: Left;   ENDOMETRIAL ABLATION     TRIGGER FINGER RELEASE Right 09/08/2020   Procedure: RELEASE TRIGGER FINGER/A-1 PULLEY RIGHT THUMB;  Surgeon: Leanora Cover, MD;  Location: [place];  Service: Orthopedics;  Laterality: Right;  30 MIN   WISDOM TOOTH EXTRACTION     age [age]   WRIST SURGERY      Medications: Current Outpatient Medications  Medication Sig Dispense Refill   Biotin 1 MG CAPS Take by mouth.     Cholecalciferol (VITAMIN D3) 50 MCG (2000 UT) capsule Take 2,000 Units by mouth daily.     EPINEPHrine 0.3 mg/0.3 mL IJ SOAJ injection USE AS DIRECTED FOR SEVERE ALLERGIC REACTIONS     Multiple Vitamin (MULTIVITAMIN) tablet Take 1 tablet by mouth daily.     tamoxifen (NOLVADEX) 10 MG tablet TAKE 1/2 TABLET BY MOUTH DAILY 45 tablet 3   No current facility-administered medications for this visit.    Subjective:  Patient presents for session on time.  Patient shared recent events, how her husband has struggled with some medical issues but has been improving recently.  They continue to cope with financial stress in part due to their having to pay for health care benefits.  She continues to seek other employment options as she continues to work 2 part-time jobs.  She went on to share  challenges at both, ways she attempts to cope.  She identified the negative cognition "seems like I make nothing but bad decisions lately".  Patient was encouraged to recognize her adjusting to several stressors over the past few months, having to make decisions consistent with her beliefs, ethics.  Patient was able to accept and reframe some associated thoughts identified in today's session.  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  Interventions: CBT,  supportive therapy, motivational interviewing  Diagnoses:    ICD-10-CM   1. Adjustment disorder with anxious mood  F43.22          Plan: Patient is to use CBT, mindfulness and coping skills to help manage / decrease symptoms associated with their diagnosis.   Long-term goals:   Maintain symptom reduction: The patient will report sustained reduction in symptoms of anxiety using coping skills and interventions for 3 consecutive months progressively.      Short-term goal:  The patient will learn and apply CBT and mindfulness-based coping skills for managing anxiety and practice using it between sessions.       2.   The patient will CBT and mindfulness-based interventions to increase awareness of negative thought patterns. 3.   The patient will engage in pleasurable activities for stress reduction.             Anson [place], [facility]

## 2021-12-19 ENCOUNTER — Ambulatory Visit: Payer: BC Managed Care – PPO | Admitting: Mental Health

## 2021-12-19 DIAGNOSIS — F4322 Adjustment disorder with anxiety: Secondary | ICD-10-CM | POA: Diagnosis not present

## 2021-12-19 NOTE — Progress Notes (Signed)
[facility] note  Name: Judith [place] Date:11/28/21 MRN: [id number] DOB: October 17, 1960 PCP: [name], PA  Time spent:  54 minutes  Treatment:   individual therapy  Mental Status Exam:    Appearance:    Casual     Behavior:   Appropriate  Motor:   WNL  Speech/Language:    Clear and Coherent  Affect:   Full range   Mood:   Euthymic, pleasant  Thought process:   Logical, linear, goal directed  Thought content:     WNL  Sensory/Perceptual disturbances:     none  Orientation:   x4  Attention:   Good  Concentration:   Good  Memory:   Intact  Fund of knowledge:    Consistent with age and development  Insight:     Good  Judgment:    Good  Impulse Control:   Good     Reported Symptoms: Anxiety, rumination, panic attacks  Risk Assessment: Danger to Self:  No Self-injurious Behavior: No Danger to Others: No Duty to Warn:no Physical Aggression / Violence:No  Access to Firearms a concern: No  Gang Involvement:No  Patient / guardian was educated about steps to take if suicide or homicide risk level increases between visits: yes While future psychiatric events cannot be accurately predicted, the patient does not currently require acute inpatient psychiatric care and does not currently meet [facility] involuntary commitment criteria.    Medical History/Surgical History: Past Medical History:  Diagnosis Date   Allergy    Breast cancer ([place])    left breast cancer diagnosed 01-05-2019- surgery 10-07-2353   Complication of anesthesia    hard time waking up   Family history of breast cancer    Family history of colon cancer    Family history of prostate cancer    Motion sickness     Past Surgical History:  Procedure Laterality Date   BREAST LUMPECTOMY WITH RADIOACTIVE SEED AND SENTINEL LYMPH NODE BIOPSY Left 02/10/2019   Procedure: LEFT BREAST LUMPECTOMY WITH RADIOACTIVE SEED AND LEFT AXILLARY SENTINEL LYMPH NODE BIOPSY;  Surgeon: Rolm Bookbinder, MD;   Location: [place];  Service: General;  Laterality: Left;   ENDOMETRIAL ABLATION     TRIGGER FINGER RELEASE Right 09/08/2020   Procedure: RELEASE TRIGGER FINGER/A-1 PULLEY RIGHT THUMB;  Surgeon: Leanora Cover, MD;  Location: [place];  Service: Orthopedics;  Laterality: Right;  30 MIN   WISDOM TOOTH EXTRACTION     age [age]   WRIST SURGERY      Medications: Current Outpatient Medications  Medication Sig Dispense Refill   Biotin 1 MG CAPS Take by mouth.     Cholecalciferol (VITAMIN D3) 50 MCG (2000 UT) capsule Take 2,000 Units by mouth daily.     EPINEPHrine 0.3 mg/0.3 mL IJ SOAJ injection USE AS DIRECTED FOR SEVERE ALLERGIC REACTIONS     Multiple Vitamin (MULTIVITAMIN) tablet Take 1 tablet by mouth daily.     tamoxifen (NOLVADEX) 10 MG tablet TAKE 1/2 TABLET BY MOUTH DAILY 45 tablet 3   No current facility-administered medications for this visit.    Subjective:  Patient presents for session on time.  Patient shared recent events, how her husband has struggled with some medical issues but has been improving recently.  They continue to cope with financial stress in part due to their having to pay for health care benefits.  She continues to seek other employment options as she continues to work 2 part-time jobs.  She went on to share  challenges at both, ways she attempts to cope.  She identified the negative cognition "seems like I make nothing but bad decisions lately".  Patient was encouraged to recognize her adjusting to several stressors over the past few months, having to make decisions consistent with her beliefs, ethics.  Patient was able to accept and reframe some associated thoughts identified in today's session.  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  Interventions: CBT,  supportive therapy, motivational interviewing  Diagnoses:  No diagnosis found.      Plan: Patient is to use CBT, mindfulness and coping skills to help manage / decrease symptoms associated with their diagnosis.   Long-term goals:   Maintain symptom reduction: The patient will report sustained reduction in symptoms of anxiety using coping skills and interventions for 3 consecutive months progressively.      Short-term goal:  The patient will learn and apply CBT and mindfulness-based coping skills for managing anxiety and practice using it between sessions.       2.   The patient will CBT and mindfulness-based interventions to increase awareness of negative thought patterns. 3.   The patient will engage in pleasurable activities for stress reduction.             Anson [place], [facility]

## 2022-01-14 ENCOUNTER — Ambulatory Visit: Payer: BC Managed Care – PPO | Admitting: Mental Health

## 2022-01-14 DIAGNOSIS — F4322 Adjustment disorder with anxiety: Secondary | ICD-10-CM | POA: Diagnosis not present

## 2022-01-14 NOTE — Progress Notes (Signed)
[facility]  Name: Judith [place] Date: 01/14/22 MRN: [id number] DOB: 12-19-1960 PCP: [name], PA  Time spent:  53 minutes  Treatment:   individual therapy  Mental Status Exam:    Appearance:    Casual     Behavior:   Appropriate  Motor:   WNL  Speech/Language:    Clear and Coherent  Affect:   Full range   Mood:   Euthymic, pleasant  Thought process:   Logical, linear, goal directed  Thought content:     WNL  Sensory/Perceptual disturbances:     none  Orientation:   x4  Attention:   Good  Concentration:   Good  Memory:   Intact  Fund of knowledge:    Consistent with age and development  Insight:     Good  Judgment:    Good  Impulse Control:   Good     Reported Symptoms: Anxiety, rumination, panic attacks  Risk Assessment: Danger to Self:  No Self-injurious Behavior: No Danger to Others: No Duty to Warn:no Physical Aggression / Violence:No  Access to Firearms a concern: No  Gang Involvement:No  Patient / guardian was educated about steps to take if suicide or homicide risk level increases between visits: yes While future psychiatric events cannot be accurately predicted, the patient does not currently require acute inpatient psychiatric care and does not currently meet [facility] involuntary commitment criteria.    Medical History/Surgical History: Past Medical History:  Diagnosis Date   Allergy    Breast cancer ([place])    left breast cancer diagnosed 01-05-2019- surgery [id number]   Complication of anesthesia    hard time waking up   Family history of breast cancer    Family history of colon cancer    Family history of prostate cancer    Motion sickness     Past Surgical History:  Procedure Laterality Date   BREAST LUMPECTOMY WITH RADIOACTIVE SEED AND SENTINEL LYMPH NODE BIOPSY Left 02/10/2019   Procedure: LEFT BREAST LUMPECTOMY WITH RADIOACTIVE SEED AND LEFT AXILLARY SENTINEL LYMPH NODE BIOPSY;  Surgeon: Rolm Bookbinder, MD;   Location: [place];  Service: General;  Laterality: Left;   ENDOMETRIAL ABLATION     TRIGGER FINGER RELEASE Right 09/08/2020   Procedure: RELEASE TRIGGER FINGER/A-1 PULLEY RIGHT THUMB;  Surgeon: Leanora Cover, MD;  Location: [place];  Service: Orthopedics;  Laterality: Right;  30 MIN   WISDOM TOOTH EXTRACTION     age [age]   WRIST SURGERY      Medications: Current Outpatient Medications  Medication Sig Dispense Refill   Biotin 1 MG CAPS Take by mouth.     Cholecalciferol (VITAMIN D3) 50 MCG (2000 UT) capsule Take 2,000 Units by mouth daily.     EPINEPHrine 0.3 mg/0.3 mL IJ SOAJ injection USE AS DIRECTED FOR SEVERE ALLERGIC REACTIONS     Multiple Vitamin (MULTIVITAMIN) tablet Take 1 tablet by mouth daily.     tamoxifen (NOLVADEX) 10 MG tablet TAKE 1/2 TABLET BY MOUTH DAILY 45 tablet 3   No current facility-administered medications for this visit.    Subjective:  Patient presents for session on time.  patient shared recent events. She and her husband continued to cope with some financial stress related to employment. She said she had some recent job interviews and has one later this week. She went on to share details related to the first interview process, some of which was on site. She plans to carefully evaluate all options before making  her decision. Patient was encouraged to recognize her being resourceful diligent in the job search process as she had been unpleased at one of our current jobs. Facilitated her identifying aspects to consider in making her decision.    Interventions: CBT, supportive therapy, motivational interviewing  Diagnoses:    ICD-10-CM   1. Adjustment disorder with anxious mood  F43.22            Plan: Patient is to use CBT, mindfulness and coping skills to help manage / decrease symptoms associated with their diagnosis.   Long-term goals:   Maintain symptom reduction: The patient will report sustained reduction in  symptoms of anxiety using coping skills and interventions for 3 consecutive months progressively.      Short-term goal:  The patient will learn and apply CBT and mindfulness-based coping skills for managing anxiety and practice using it between sessions.       2.   The patient will CBT and mindfulness-based interventions to increase awareness of negative thought patterns. 3.   The patient will engage in pleasurable activities for stress reduction.             Anson [place], [facility]

## 2022-02-15 ENCOUNTER — Ambulatory Visit: Payer: BC Managed Care – PPO | Admitting: Mental Health

## 2022-02-15 DIAGNOSIS — F4322 Adjustment disorder with anxiety: Secondary | ICD-10-CM

## 2022-02-15 NOTE — Progress Notes (Signed)
[facility]  Name: Judith Dixon Date: 01/14/22 MRN: [id number] DOB: September 30, 1960 PCP: [name], PA  Time spent:  53 minutes  Treatment:   individual therapy  Mental Status Exam:    Appearance:    Casual     Behavior:   Appropriate  Motor:   WNL  Speech/Language:    Clear and Coherent  Affect:   Full range   Mood:   Euthymic, pleasant  Thought process:   Logical, linear, goal directed  Thought content:     WNL  Sensory/Perceptual disturbances:     none  Orientation:   x4  Attention:   Good  Concentration:   Good  Memory:   Intact  Fund of knowledge:    Consistent with age and development  Insight:     Good  Judgment:    Good  Impulse Control:   Good     Reported Symptoms: Anxiety, rumination, panic attacks  Risk Assessment: Danger to Self:  No Self-injurious Behavior: No Danger to Others: No Duty to Warn:no Physical Aggression / Violence:No  Access to Firearms a concern: No  Gang Involvement:No  Patient / guardian was educated about steps to take if suicide or homicide risk level increases between visits: yes While future psychiatric events cannot be accurately predicted, the patient does not currently require acute inpatient psychiatric care and does not currently meet [facility] involuntary commitment criteria.    Medical History/Surgical History: Past Medical History:  Diagnosis Date   Allergy    Breast cancer ([place])    left breast cancer diagnosed 01-05-2019- surgery 09-08-4625   Complication of anesthesia    hard time waking up   Family history of breast cancer    Family history of colon cancer    Family history of prostate cancer    Motion sickness     Past Surgical History:  Procedure Laterality Date   BREAST LUMPECTOMY WITH RADIOACTIVE SEED AND SENTINEL LYMPH NODE BIOPSY Left 02/10/2019   Procedure: LEFT BREAST LUMPECTOMY WITH RADIOACTIVE SEED AND LEFT AXILLARY SENTINEL LYMPH NODE BIOPSY;  Surgeon: Rolm Bookbinder, MD;   Location: [place];  Service: General;  Laterality: Left;   ENDOMETRIAL ABLATION     TRIGGER FINGER RELEASE Right 09/08/2020   Procedure: RELEASE TRIGGER FINGER/A-1 PULLEY RIGHT THUMB;  Surgeon: Leanora Cover, MD;  Location: [place];  Service: Orthopedics;  Laterality: Right;  30 MIN   WISDOM TOOTH EXTRACTION     age [age]   WRIST SURGERY      Medications: Current Outpatient Medications  Medication Sig Dispense Refill   Biotin 1 MG CAPS Take by mouth.     Cholecalciferol (VITAMIN D3) 50 MCG (2000 UT) capsule Take 2,000 Units by mouth daily.     EPINEPHrine 0.3 mg/0.3 mL IJ SOAJ injection USE AS DIRECTED FOR SEVERE ALLERGIC REACTIONS     Multiple Vitamin (MULTIVITAMIN) tablet Take 1 tablet by mouth daily.     tamoxifen (NOLVADEX) 10 MG tablet TAKE 1/2 TABLET BY MOUTH DAILY 45 tablet 3   No current facility-administered medications for this visit.    Subjective:  Patient presents for session on time.  patient shared recent events.    She and her husband continued to cope with some financial stress related to employment. She said she had some recent job interviews and has one later this week. She went on to share details related to the first interview process, some of which was on site. She plans to carefully evaluate all  options before making her decision. Patient was encouraged to recognize her being resourceful diligent in the job search process as she had been unpleased at one of our current jobs. Facilitated her identifying aspects to consider in making her decision.    Interventions: CBT, supportive therapy, motivational interviewing  Diagnoses:  No diagnosis found.        Plan: Patient is to use CBT, mindfulness and coping skills to help manage / decrease symptoms associated with their diagnosis.   Long-term goals:   Maintain symptom reduction: The patient will report sustained reduction in symptoms of anxiety using coping skills and  interventions for 3 consecutive months progressively.      Short-term goal:  The patient will learn and apply CBT and mindfulness-based coping skills for managing anxiety and practice using it between sessions.       2.   The patient will CBT and mindfulness-based interventions to increase awareness of negative thought patterns. 3.   The patient will engage in pleasurable activities for stress reduction.             Anson [place], [facility]

## 2022-03-22 ENCOUNTER — Ambulatory Visit: Payer: BC Managed Care – PPO | Admitting: Mental Health

## 2022-03-22 DIAGNOSIS — F4322 Adjustment disorder with anxiety: Secondary | ICD-10-CM

## 2022-03-22 NOTE — Progress Notes (Signed)
[facility] psychotherapy note  Name: Judith Dixon Date: 03/22/22 MRN: [id number] DOB: [birth year] PCP: [name], PA  Time spent:  55 minutes  Treatment:   individual therapy  Mental Status Exam:    Appearance:    Casual     Behavior:   Appropriate  Motor:   WNL  Speech/Language:    Clear and Coherent  Affect:   Full range   Mood:   Anxious, pleasant  Thought process:   Logical, linear, goal directed  Thought content:     WNL  Sensory/Perceptual disturbances:     none  Orientation:   x4  Attention:   Good  Concentration:   Good  Memory:   Intact  Fund of knowledge:    Consistent with age and development  Insight:     Good  Judgment:    Good  Impulse Control:   Good     Reported Symptoms: Anxiety, rumination, panic attacks  Risk Assessment: Danger to Self:  No Self-injurious Behavior: No Danger to Others: No Duty to Warn:no Physical Aggression / Violence:No  Access to Firearms a concern: No  Gang Involvement:No  Patient / guardian was educated about steps to take if suicide or homicide risk level increases between visits: yes While future psychiatric events cannot be accurately predicted, the patient does not currently require acute inpatient psychiatric care and does not currently meet [facility] involuntary commitment criteria.    Medical History/Surgical History: Past Medical History:  Diagnosis Date   Allergy    Breast cancer ([place])    left breast cancer diagnosed 01-05-2019- surgery [id number]   Complication of anesthesia    hard time waking up   Family history of breast cancer    Family history of colon cancer    Family history of prostate cancer    Motion sickness     Past Surgical History:  Procedure Laterality Date   BREAST LUMPECTOMY WITH RADIOACTIVE SEED AND SENTINEL LYMPH NODE BIOPSY Left 02/10/2019   Procedure: LEFT BREAST LUMPECTOMY WITH RADIOACTIVE SEED AND LEFT AXILLARY SENTINEL LYMPH NODE BIOPSY;  Surgeon: Rolm Bookbinder, MD;   Location: [place];  Service: General;  Laterality: Left;   ENDOMETRIAL ABLATION     TRIGGER FINGER RELEASE Right 09/08/2020   Procedure: RELEASE TRIGGER FINGER/A-1 PULLEY RIGHT THUMB;  Surgeon: Leanora Cover, MD;  Location: [place];  Service: Orthopedics;  Laterality: Right;  30 MIN   WISDOM TOOTH EXTRACTION     age 48-20   WRIST SURGERY      Medications: Current Outpatient Medications  Medication Sig Dispense Refill   Biotin 1 MG CAPS Take by mouth.     Cholecalciferol (VITAMIN D3) 50 MCG (2000 UT) capsule Take 2,000 Units by mouth daily.     EPINEPHrine 0.3 mg/0.3 mL IJ SOAJ injection USE AS DIRECTED FOR SEVERE ALLERGIC REACTIONS     Multiple Vitamin (MULTIVITAMIN) tablet Take 1 tablet by mouth daily.     tamoxifen (NOLVADEX) 10 MG tablet TAKE 1/2 TABLET BY MOUTH DAILY 45 tablet 3   No current facility-administered medications for this visit.    Subjective:  Patient presents for session on time.  Assessed progress.  Patient shared how she continues to look for employment.  She stated she is recently applied at a local university and hopes to hear back soon.  She is also applying for another job that would give her summers off.  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  She stated she has decided to not go forward with this opportunity, sharing her reasons.  She identified how she is work toward setting some interpersonal boundaries toward letting go of what she does not have to handle or feel like she has to control.  We facilitated her sharing how this helps with her anxiety and stress.  She stated they continue to cope with some financial stress but she and her husband have had some meaningful recent discussions.  Patient went on to share how they have other financial options through family and she stated she tries to remind her husband of this as she stated  sometimes she has to help him not worry. Explored ways to cope and care for herself, provide support throughout facilitating her identifying needs and areas with which to follow through.  Interventions: CBT, supportive therapy, motivational interviewing  Diagnoses:    ICD-10-CM   1. Adjustment disorder with anxious mood  F43.22        Plan: Patient is to use CBT, mindfulness and coping skills to help manage / decrease symptoms associated with their diagnosis.   Long-term goals:   Maintain symptom reduction: The patient will report sustained reduction in symptoms of anxiety using coping skills and interventions for 3 consecutive months progressively.      Short-term goal:  The patient will learn and apply CBT and mindfulness-based coping skills for managing anxiety and practice using it between sessions.       2.   The patient will CBT and mindfulness-based interventions to increase awareness of negative thought patterns. 3.   The patient will engage in pleasurable activities for stress reduction.             Anson [place], [facility]

## 2022-04-12 ENCOUNTER — Ambulatory Visit: Payer: BC Managed Care – PPO | Admitting: Mental Health

## 2022-04-19 ENCOUNTER — Ambulatory Visit: Payer: BC Managed Care – PPO | Admitting: Mental Health

## 2022-06-08 NOTE — Progress Notes (Signed)
Patient Care Team: Wilfrid Lund, PA as PCP - General (Family Medicine) Emelia Loron, MD as Consulting Physician (General Surgery) Serena Croissant, MD as Consulting Physician (Hematology and Oncology) Antony Blackbird, MD as Consulting Physician (Radiation Oncology)  DIAGNOSIS: No diagnosis found.  SUMMARY OF ONCOLOGIC HISTORY: Oncology History  Malignant neoplasm of upper-outer quadrant of left breast in female, estrogen receptor positive (HCC)  01/05/2019 Cancer Staging   Staging form: Breast, AJCC 8th Edition - Clinical stage from 01/05/2019: Stage IA (cT1b, cN0, cM0, G2, ER+, PR+, HER2-)   01/05/2019 Initial Diagnosis   Routine screening mammogram detected a 0.6cm architectural distortion in the left breast. Biopsy showed IDC with DCIS, grade 2, HER-2 - by FISH, ER+ 90%, PR+ 85%, Ki67 15%.   01/27/2019 Genetic Testing   Negative genetic testing. No pathogenic variants identified. VUS in CHEK2 called c.8G>A identified on the [facility] Breast Cancer STAT Panel + Common Hereditary Cancers Panel. The STAT Breast cancer panel offered by [facility] includes sequencing and rearrangement analysis for the following 9 genes:  ATM, BRCA1, BRCA2, CDH1, CHEK2, PALB2, PTEN, STK11 and TP53.  The Common Hereditary Cancers Panel offered by [facility] includes sequencing and/or deletion duplication testing of the following 48 genes: APC, ATM, AXIN2, BARD1, BMPR1A, BRCA1, BRCA2, BRIP1, CDH1, CDKN2A (p14ARF), CDKN2A (p16INK4a), CKD4, CHEK2, CTNNA1, DICER1, EPCAM (Deletion/duplication testing only), GREM1 (promoter region deletion/duplication testing only), KIT, MEN1, MLH1, MSH2, MSH3, MSH6, MUTYH, NBN, NF1, NHTL1, PALB2, PDGFRA, PMS2, POLD1, POLE, PTEN, RAD50, RAD51C, RAD51D, RNF43, SDHB, SDHC, SDHD, SMAD4, SMARCA4. STK11, TP53, TSC1, TSC2, and VHL.  The following genes were evaluated for sequence changes only: SDHA and HOXB13 c.251G>A variant only. The report date is 01/26/2019.    02/10/2019 Cancer Staging    Staging form: Breast, AJCC 8th Edition - Pathologic stage from 02/10/2019: Stage IA (pT1b, pN0, cM0, G2, ER+, PR+, HER2-) - Signed by Loa Socks, NP on 02/24/2019   02/10/2019 Surgery   Left lumpectomy Dwain Sarna) [phone]): IDC, grade 2, 0.6cm, with intermediate grade DCIS, clear margins, 4 left axillary lymph nodes negative    03/02/2019 Oncotype testing   The Oncotype DX score was 13 predicting a risk of outside the breast recurrence over the next 9 years of 4% if the patient's only systemic therapy is tamoxifen for 5 years.    04/06/2019 - 05/03/2019 Radiation Therapy   The patient initially received a dose of 40.05 Gy in 15 fractions to the breast using whole-breast tangent fields. This was delivered using a 3-D conformal technique. The pt received a boost delivering an additional 12 Gy in 6 fractions using a electron boost with electrons. The total dose was 52.05 Gy.    05/24/2019 -  Anti-estrogen oral therapy   Anastrozole could not tolerate it because of severe hot flashes and joint stiffness at even half the dose, switched to letrozole (she will start at 1/4 tablet daily) on 12/21/2019     CHIEF COMPLIANT: Follow-up of left breast cancer on Tamoxifen   INTERVAL HISTORY: Judith Dixon is a 62 y.o. with above-mentioned history of  left breast cancer who underwent a lumpectomy, radiation, and is currently on tamoxifen,  She reports to the clinic today for follow-up.    ALLERGIES:  has No Known Allergies.  MEDICATIONS:  Current Outpatient Medications  Medication Sig Dispense Refill   Biotin 1 MG CAPS Take by mouth.     Cholecalciferol (VITAMIN D3) 50 MCG (2000 UT) capsule Take 2,000 Units by mouth daily.     EPINEPHrine 0.3 mg/0.3  mL IJ SOAJ injection USE AS DIRECTED FOR SEVERE ALLERGIC REACTIONS     Multiple Vitamin (MULTIVITAMIN) tablet Take 1 tablet by mouth daily.     tamoxifen (NOLVADEX) 10 MG tablet TAKE 1/2 TABLET BY MOUTH DAILY 45 tablet 3   No current  facility-administered medications for this visit.    PHYSICAL EXAMINATION: ECOG PERFORMANCE STATUS: {CHL ONC ECOG [phone]}  There were no vitals filed for this visit. There were no vitals filed for this visit.  BREAST:*** No palpable masses or nodules in either right or left breasts. No palpable axillary supraclavicular or infraclavicular adenopathy no breast tenderness or nipple discharge. (exam performed in the presence of a chaperone)  LABORATORY DATA:  I have reviewed the data as listed    Latest Ref Rng & Units 08/10/2019    8:44 AM 04/30/2019   10:37 AM  CMP  Glucose 70 - 99 mg/dL 84  92   BUN 6 - 20 mg/dL 15  16   Creatinine 0.98 - 1.00 mg/dL 1.19  1.47   Sodium 829 - 145 mmol/L 141  142   Potassium 3.5 - 5.1 mmol/L 4.1  4.0   Chloride 98 - 111 mmol/L 107  104   CO2 22 - 32 mmol/L 26  29   Calcium 8.9 - 10.3 mg/dL 9.9  9.4   Total Protein 6.5 - 8.1 g/dL 7.4  7.2   Total Bilirubin 0.3 - 1.2 mg/dL 0.8  0.8   Alkaline Phos 38 - 126 U/L 78  78   AST 15 - 41 U/L 19  18   ALT 0 - 44 U/L 19  18     Lab Results  Component Value Date   WBC 5.9 08/10/2019   HGB 12.6 08/10/2019   HCT 37.9 08/10/2019   MCV 93.6 08/10/2019   PLT 184 08/10/2019   NEUTROABS 3.8 08/10/2019    ASSESSMENT & PLAN:  No problem-specific Assessment & Plan notes found for this encounter.    No orders of the defined types were placed in this encounter.  The patient has a good understanding of the overall plan. she agrees with it. she will call with any problems that may develop before the next visit here. Total time spent: 30 mins including face to face time and time spent for planning, charting and co-ordination of care   Sherlyn Lick, CMA 06/08/22    I Janan Ridge am acting as a Neurosurgeon for [employer]  ***

## 2022-06-12 ENCOUNTER — Inpatient Hospital Stay: Payer: BC Managed Care – PPO | Attending: Hematology and Oncology | Admitting: Hematology and Oncology

## 2022-06-12 VITALS — BP 126/78 | HR 84 | Temp 97.2°F | Resp 18 | Ht 64.0 in | Wt 139.8 lb

## 2022-06-12 DIAGNOSIS — C50412 Malignant neoplasm of upper-outer quadrant of left female breast: Secondary | ICD-10-CM | POA: Diagnosis not present

## 2022-06-12 DIAGNOSIS — R232 Flushing: Secondary | ICD-10-CM | POA: Diagnosis not present

## 2022-06-12 DIAGNOSIS — Z170 Estrogen receptor positive status [ER+]: Secondary | ICD-10-CM | POA: Diagnosis not present

## 2022-06-12 DIAGNOSIS — Z79811 Long term (current) use of aromatase inhibitors: Secondary | ICD-10-CM | POA: Insufficient documentation

## 2022-06-12 DIAGNOSIS — R5383 Other fatigue: Secondary | ICD-10-CM | POA: Insufficient documentation

## 2022-06-12 DIAGNOSIS — D649 Anemia, unspecified: Secondary | ICD-10-CM | POA: Diagnosis not present

## 2022-06-12 DIAGNOSIS — Z923 Personal history of irradiation: Secondary | ICD-10-CM | POA: Diagnosis not present

## 2022-06-12 NOTE — Assessment & Plan Note (Addendum)
02/10/2019:Left lumpectomy Judith Dixon): IDC, grade 2, 0.6cm, with intermediate grade DCIS, clear margins, 4 left axillary lymph nodes negative T1BN0 stage Ia Oncotype DX score 13: Low risk: 4%Risk of distant recurrence   Treatment plan: 1.  Adjuvant radiation completed 05/03/2019 2.  Adjuvant antiestrogen therapy with anastrozole 1 mg daily x5 years started 05/24/2019 discontinued September 2021.  She could not tolerate half a dose of anastrozole.  Switched to letrozole 1/4 tablet daily 12/21/2019, switched to tamoxifen 5 mg daily 09/18/2020 (plan to treat her till April 2026) --------------------------------------------------------------------------------------- Tamoxifen toxicities:  Hot flashes: Still present but better Joint stiffness has resolved 3.  Fogginess in the head: Improved with holding tamoxifen 4.  Fatigue  Trigger fingers and trigger thumb: Status post surgery.    Breast cancer surveillance: Mammogram 11/16/2021: At [facility]: Benign breast density category C       Return to clinic in 1 year for follow-up.

## 2023-04-15 DIAGNOSIS — Z872 Personal history of diseases of the skin and subcutaneous tissue: Secondary | ICD-10-CM | POA: Diagnosis not present

## 2023-06-06 ENCOUNTER — Telehealth: Payer: Self-pay | Admitting: Hematology and Oncology

## 2023-06-06 NOTE — Telephone Encounter (Signed)
 Judith Dixon left a voicemail to reschedule her appointment, I returned Judith Dixon's call and she rescheduled. Judith Dixon was acceptable to the appointment dates and times provided.

## 2023-06-12 ENCOUNTER — Inpatient Hospital Stay: Payer: BC Managed Care – PPO | Admitting: Hematology and Oncology

## 2023-06-17 DIAGNOSIS — Z8639 Personal history of other endocrine, nutritional and metabolic disease: Secondary | ICD-10-CM | POA: Diagnosis not present

## 2023-06-17 DIAGNOSIS — R5383 Other fatigue: Secondary | ICD-10-CM | POA: Diagnosis not present

## 2023-06-17 DIAGNOSIS — L659 Nonscarring hair loss, unspecified: Secondary | ICD-10-CM | POA: Diagnosis not present

## 2023-06-17 DIAGNOSIS — E039 Hypothyroidism, unspecified: Secondary | ICD-10-CM | POA: Diagnosis not present

## 2023-06-19 ENCOUNTER — Inpatient Hospital Stay: Payer: Self-pay | Attending: Hematology and Oncology | Admitting: Hematology and Oncology

## 2023-06-19 VITALS — BP 131/72 | HR 76 | Temp 98.7°F | Resp 16 | Ht 64.0 in | Wt 136.5 lb

## 2023-06-19 DIAGNOSIS — E0590 Thyrotoxicosis, unspecified without thyrotoxic crisis or storm: Secondary | ICD-10-CM | POA: Diagnosis not present

## 2023-06-19 DIAGNOSIS — Z170 Estrogen receptor positive status [ER+]: Secondary | ICD-10-CM

## 2023-06-19 DIAGNOSIS — C50412 Malignant neoplasm of upper-outer quadrant of left female breast: Secondary | ICD-10-CM

## 2023-06-19 DIAGNOSIS — Z923 Personal history of irradiation: Secondary | ICD-10-CM | POA: Insufficient documentation

## 2023-06-19 DIAGNOSIS — Z853 Personal history of malignant neoplasm of breast: Secondary | ICD-10-CM | POA: Diagnosis not present

## 2023-06-19 NOTE — Progress Notes (Unsigned)
 Patient Care Team: Sinda Duel, PA as PCP - General (Family Medicine) Enid Harry, MD as Consulting Physician (General Surgery) Cameron Cea, MD as Consulting Physician (Hematology and Oncology) Retta Caster, MD as Consulting Physician (Radiation Oncology)  DIAGNOSIS:  Encounter Diagnosis  Name Primary?   Malignant neoplasm of upper-outer quadrant of left breast in female, estrogen receptor positive (HCC) Yes    SUMMARY OF ONCOLOGIC HISTORY: Oncology History  Malignant neoplasm of upper-outer quadrant of left breast in female, estrogen receptor positive (HCC)  01/05/2019 Cancer Staging   Staging form: Breast, AJCC 8th Edition - Clinical stage from 01/05/2019: Stage IA (cT1b, cN0, cM0, G2, ER+, PR+, HER2-)   01/05/2019 Initial Diagnosis   Routine screening mammogram detected a 0.6cm architectural distortion in the left breast. Biopsy showed IDC with DCIS, grade 2, HER-2 - by FISH, ER+ 90%, PR+ 85%, Ki67 15%.   01/27/2019 Genetic Testing   Negative genetic testing. No pathogenic variants identified. VUS in CHEK2 called c.8G>A identified on the [facility] Breast Cancer STAT Panel + Common Hereditary Cancers Panel. The STAT Breast cancer panel offered by [facility] includes sequencing and rearrangement analysis for the following 9 genes:  ATM, BRCA1, BRCA2, CDH1, CHEK2, PALB2, PTEN, STK11 and TP53.  The Common Hereditary Cancers Panel offered by [facility] includes sequencing and/or deletion duplication testing of the following 48 genes: APC, ATM, AXIN2, BARD1, BMPR1A, BRCA1, BRCA2, BRIP1, CDH1, CDKN2A (p14ARF), CDKN2A (p16INK4a), CKD4, CHEK2, CTNNA1, DICER1, EPCAM (Deletion/duplication testing only), GREM1 (promoter region deletion/duplication testing only), KIT, MEN1, MLH1, MSH2, MSH3, MSH6, MUTYH, NBN, NF1, NHTL1, PALB2, PDGFRA, PMS2, POLD1, POLE, PTEN, RAD50, RAD51C, RAD51D, RNF43, SDHB, SDHC, SDHD, SMAD4, SMARCA4. STK11, TP53, TSC1, TSC2, and VHL.  The following genes were evaluated for  sequence changes only: SDHA and HOXB13 c.251G>A variant only. The report date is 01/26/2019.    02/10/2019 Cancer Staging   Staging form: Breast, AJCC 8th Edition - Pathologic stage from 02/10/2019: Stage IA (pT1b, pN0, cM0, G2, ER+, PR+, HER2-) - Signed by Percival Brace, NP on 02/24/2019   02/10/2019 Surgery   Left lumpectomy [name]) [phone]): IDC, grade 2, 0.6cm, with intermediate grade DCIS, clear margins, 4 left axillary lymph nodes negative    03/02/2019 Oncotype testing   The Oncotype DX score was 13 predicting a risk of outside the breast recurrence over the next 9 years of 4% if the patient's only systemic therapy is tamoxifen  for 5 years.    04/06/2019 - 05/03/2019 Radiation Therapy   The patient initially received a dose of 40.05 Gy in 15 fractions to the breast using whole-breast tangent fields. This was delivered using a 3-D conformal technique. The pt received a boost delivering an additional 12 Gy in 6 fractions using a electron boost with electrons. The total dose was 52.05 Gy.    05/24/2019 -  Anti-estrogen oral therapy   Anastrozole  could not tolerate it because of severe hot flashes and joint stiffness at even half the dose, switched to letrozole  (she will start at 1/4 tablet daily) on 12/21/2019     CHIEF COMPLIANT: Surveillance of breast cancer  HISTORY OF PRESENT ILLNESS: History of Present Illness Judith Dixon "Judith Dixon" is a 63 year old female with a history of breast cancer who presents for follow-up regarding her medication and thyroid concerns.  She discontinued tamoxifen  around July of last year after over three years of use, initially reducing the dose due to intolerance. She experiences fatigue, hair thinning, and weight loss without dietary changes. Blood work shows elevated free  T4 levels, with follow-up testing scheduled in six weeks to monitor thyroid function.  Her oncotype was low risk, and she maintains routine mammograms, which have  returned to normal screening. She is not on any other medications besides supplements, and her blood pressure is generally well-controlled.  She experiences persistent fatigue, difficulty sleeping, and weight loss without dietary changes. There is no thyroid enlargement. She also has difficulty recalling words, which she attributes to multitasking and job demands.     ALLERGIES:  has no known allergies.  MEDICATIONS:  Current Outpatient Medications  Medication Sig Dispense Refill   Biotin 1 MG CAPS Take by mouth.     Cholecalciferol (VITAMIN D3) 50 MCG (2000 UT) capsule Take 2,000 Units by mouth daily.     EPINEPHrine  0.3 mg/0.3 mL IJ SOAJ injection USE AS DIRECTED FOR SEVERE ALLERGIC REACTIONS     Multiple Vitamin (MULTIVITAMIN) tablet Take 1 tablet by mouth daily.     tamoxifen  (NOLVADEX ) 10 MG tablet TAKE 1/2 TABLET BY MOUTH DAILY 45 tablet 3   No current facility-administered medications for this visit.    PHYSICAL EXAMINATION: ECOG PERFORMANCE STATUS: 1 - Symptomatic but completely ambulatory  Vitals:   06/19/23 1518  BP: 131/72  Pulse: 76  Resp: 16  Temp: 98.7 F (37.1 C)  SpO2: 98%   Filed Weights   06/19/23 1518  Weight: 136 lb 8 oz (61.9 kg)    LABORATORY DATA:  I have reviewed the data as listed    Latest Ref Rng & Units 08/10/2019    8:44 AM 04/30/2019   10:37 AM  CMP  Glucose 70 - 99 mg/dL 84  92   BUN 6 - 20 mg/dL 15  16   Creatinine 1.91 - 1.00 mg/dL 4.78  2.95   Sodium 621 - 145 mmol/L 141  142   Potassium 3.5 - 5.1 mmol/L 4.1  4.0   Chloride 98 - 111 mmol/L 107  104   CO2 22 - 32 mmol/L 26  29   Calcium 8.9 - 10.3 mg/dL 9.9  9.4   Total Protein 6.5 - 8.1 g/dL 7.4  7.2   Total Bilirubin 0.3 - 1.2 mg/dL 0.8  0.8   Alkaline Phos 38 - 126 U/L 78  78   AST 15 - 41 U/L 19  18   ALT 0 - 44 U/L 19  18     Lab Results  Component Value Date   WBC 5.9 08/10/2019   HGB 12.6 08/10/2019   HCT 37.9 08/10/2019   MCV 93.6 08/10/2019   PLT 184 08/10/2019    NEUTROABS 3.8 08/10/2019    ASSESSMENT & PLAN:  Malignant neoplasm of upper-outer quadrant of left breast in female, estrogen receptor positive (HCC) 02/10/2019:Left lumpectomy [name]): IDC, grade 2, 0.6cm, with intermediate grade DCIS, clear margins, 4 left axillary lymph nodes negative T1BN0 stage Ia Oncotype DX score 13: Low risk: 4%Risk of distant recurrence   Treatment plan: 1.  Adjuvant radiation completed 05/03/2019 2.  Adjuvant antiestrogen therapy with anastrozole  1 mg daily x5 years started 05/24/2019 discontinued September 2021.  She could not tolerate half a dose of anastrozole .  Switched to letrozole  1/4 tablet daily 12/21/2019, switched to tamoxifen  5 mg daily 09/18/2020 (stopped Nov 2024) --------------------------------------------------------------------------------------- Breast cancer surveillance: Mammogram 11/29/2022: At [facility]: Benign breast density category C    Breast exam 06/19/2023: Benign    Return to clinic in 1 year for follow-up.   ------------------------------------- Assessment and Plan Assessment & Plan Malignant neoplasm of upper-outer quadrant  of left breast Estrogen receptor-positive breast cancer in the upper-outer quadrant of the left breast. Completed over three years of tamoxifen  therapy. Oncotype was low risk. Currently off tamoxifen  due to side effects. Discussed Gardant blood test for detecting breast cancer DNA, which is highly sensitive and can detect cancer DNA up to a year before clinical manifestation. False positives are rare. Insurance typically covers the test, and there is no cost to her if not covered. - Discontinue tamoxifen . - Order Gardant blood test for breast cancer DNA detection every six months. - Ensure mammograms are up to date.  Hyperthyroidism Elevated free T4 levels suggest hyperthyroidism. Awaiting follow-up blood work to confirm diagnosis and guide management. - Await follow-up blood work for thyroid function in six  weeks.   No orders of the defined types were placed in this encounter.  The patient has a good understanding of the overall plan. she agrees with it. she will call with any problems that may develop before the next visit here. Total time spent: 30 mins including face to face time and time spent for planning, charting and co-ordination of care   Viinay K Vicki Pasqual, MD 06/19/23

## 2023-06-19 NOTE — Assessment & Plan Note (Signed)
 02/10/2019:Left lumpectomy Judith Dixon): IDC, grade 2, 0.6cm, with intermediate grade DCIS, clear margins, 4 left axillary lymph nodes negative T1BN0 stage Ia Oncotype DX score 13: Low risk: 4%Risk of distant recurrence   Treatment plan: 1.  Adjuvant radiation completed 05/03/2019 2.  Adjuvant antiestrogen therapy with anastrozole  1 mg daily x5 years started 05/24/2019 discontinued September 2021.  She could not tolerate half a dose of anastrozole .  Switched to letrozole  1/4 tablet daily 12/21/2019, switched to tamoxifen  5 mg daily 09/18/2020 (plan to treat her till April 2026) --------------------------------------------------------------------------------------- Tamoxifen  toxicities:  Hot flashes: Still present but better Joint stiffness has resolved 3.  Fogginess in the head: Improved with holding tamoxifen  4.  Fatigue   Trigger fingers and trigger thumb: Status post surgery.    Breast cancer surveillance: Mammogram 11/29/2022: At [facility]: Benign breast density category C    Breast exam 06/19/2023: Benign    Return to clinic in 1 year for follow-up.

## 2023-06-20 ENCOUNTER — Encounter: Payer: Self-pay | Admitting: *Deleted

## 2023-06-20 NOTE — Progress Notes (Signed)
 Per MD request RN faxed Guardant reveal order.

## 2023-06-25 ENCOUNTER — Encounter: Payer: Self-pay | Admitting: Diagnostic Radiology

## 2023-06-29 DIAGNOSIS — C50412 Malignant neoplasm of upper-outer quadrant of left female breast: Secondary | ICD-10-CM | POA: Diagnosis not present

## 2023-07-01 ENCOUNTER — Telehealth: Payer: Self-pay | Admitting: *Deleted

## 2023-07-01 NOTE — Telephone Encounter (Signed)
 Per MD request RN placed call to pt with recent Guardant Reveal results being negative.  Pt educated and verbalized understanding.

## 2023-07-04 ENCOUNTER — Encounter: Payer: Self-pay | Admitting: Hematology and Oncology

## 2023-07-25 DIAGNOSIS — Z13220 Encounter for screening for lipoid disorders: Secondary | ICD-10-CM | POA: Diagnosis not present

## 2023-07-25 DIAGNOSIS — Z0000 Encounter for general adult medical examination without abnormal findings: Secondary | ICD-10-CM | POA: Diagnosis not present

## 2023-07-25 DIAGNOSIS — E039 Hypothyroidism, unspecified: Secondary | ICD-10-CM | POA: Diagnosis not present

## 2023-08-15 DIAGNOSIS — M8588 Other specified disorders of bone density and structure, other site: Secondary | ICD-10-CM | POA: Diagnosis not present

## 2023-08-15 DIAGNOSIS — E2839 Other primary ovarian failure: Secondary | ICD-10-CM | POA: Diagnosis not present

## 2023-08-15 DIAGNOSIS — Z853 Personal history of malignant neoplasm of breast: Secondary | ICD-10-CM | POA: Diagnosis not present

## 2023-11-11 DIAGNOSIS — R519 Headache, unspecified: Secondary | ICD-10-CM | POA: Diagnosis not present

## 2023-11-11 DIAGNOSIS — R059 Cough, unspecified: Secondary | ICD-10-CM | POA: Diagnosis not present

## 2023-11-11 DIAGNOSIS — R0981 Nasal congestion: Secondary | ICD-10-CM | POA: Diagnosis not present

## 2023-12-05 DIAGNOSIS — Z1231 Encounter for screening mammogram for malignant neoplasm of breast: Secondary | ICD-10-CM | POA: Diagnosis not present

## 2024-01-05 ENCOUNTER — Encounter: Payer: Self-pay | Admitting: Hematology and Oncology

## 2024-06-21 ENCOUNTER — Ambulatory Visit: Admitting: Hematology and Oncology
